# Patient Record
Sex: Male | Born: 2000 | Race: Black or African American | Hispanic: No | Marital: Single | Smoking: Never smoker
Health system: Southern US, Community
[De-identification: ages and names within clinical notes are randomized; demographics above are authoritative.]

---
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
## 2021-12-02 ENCOUNTER — Emergency Department
Admission: EM | Admit: 2021-12-02 | Discharge: 2021-12-02 | Disposition: A | Discharge status: Home / Self Care | Payer: BC Managed Care – PPO | Attending: Emergency Medicine | Admitting: Emergency Medicine

## 2021-12-02 ENCOUNTER — Encounter: Payer: Self-pay | Admitting: Emergency Medicine

## 2021-12-02 ENCOUNTER — Other Ambulatory Visit: Payer: Self-pay

## 2021-12-02 DIAGNOSIS — S1191XA Laceration without foreign body of unspecified part of neck, initial encounter: Secondary | ICD-10-CM | POA: Diagnosis not present

## 2021-12-02 DIAGNOSIS — S6991XA Unspecified injury of right wrist, hand and finger(s), initial encounter: Secondary | ICD-10-CM | POA: Diagnosis present

## 2021-12-02 DIAGNOSIS — S61411A Laceration without foreign body of right hand, initial encounter: Secondary | ICD-10-CM | POA: Diagnosis not present

## 2021-12-02 DIAGNOSIS — X58XXXA Exposure to other specified factors, initial encounter: Secondary | ICD-10-CM | POA: Diagnosis not present

## 2021-12-02 MED ORDER — LIDOCAINE-EPINEPHRINE (PF) 2 %-1:200000 IJ SOLN
10.0000 mL | Freq: Once | INTRAMUSCULAR | Status: AC
  Administered 2021-12-02: 10 mL
  Filled 2021-12-02: qty 20

## 2021-12-02 MED ORDER — CEPHALEXIN 500 MG PO CAPS: 1000.0000 mg | ORAL_CAPSULE | Freq: Two times a day (BID) | ORAL | 0 refills | Status: DC

## 2021-12-02 MED ORDER — HYDROXYZINE PAMOATE 100 MG PO CAPS: 100.0000 mg | ORAL_CAPSULE | Freq: Every evening | ORAL | 0 refills | Status: DC

## 2021-12-02 NOTE — Discharge Instructions (Addendum)
-  Please return to the emergency department, urgent care, or see your regular doctor in 7 to 10 days for suture removal.  -I recommend scheduling appointment with [facility].  You can call them at [phone] to schedule an appointment.  Address is [address], [place], [place] [address]  -Take all of your antibiotics as prescribed.  -Return to the emergency department anytime if you begin to experience any new or worsening symptoms.
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
## 2021-12-02 NOTE — ED Notes (Signed)
Family at bedside   Pt is walking out the door  Family is encouraging pt stay

## 2021-12-03 ENCOUNTER — Emergency Department
Admission: EM | Admit: 2021-12-03 | Discharge: 2021-12-04 | Disposition: A | Discharge status: Other Acute Inpatient Hospital | Payer: BC Managed Care – PPO | Source: Home / Self Care | Attending: Emergency Medicine | Admitting: Emergency Medicine

## 2021-12-03 ENCOUNTER — Other Ambulatory Visit: Payer: Self-pay

## 2021-12-03 DIAGNOSIS — Z20822 Contact with and (suspected) exposure to covid-19: Secondary | ICD-10-CM | POA: Insufficient documentation

## 2021-12-03 DIAGNOSIS — R4689 Other symptoms and signs involving appearance and behavior: Secondary | ICD-10-CM

## 2021-12-03 DIAGNOSIS — Z1339 Encounter for screening examination for other mental health and behavioral disorders: Secondary | ICD-10-CM | POA: Insufficient documentation

## 2021-12-03 DIAGNOSIS — F39 Unspecified mood [affective] disorder: Secondary | ICD-10-CM | POA: Diagnosis not present

## 2021-12-03 DIAGNOSIS — Z046 Encounter for general psychiatric examination, requested by authority: Secondary | ICD-10-CM | POA: Insufficient documentation

## 2021-12-03 DIAGNOSIS — R462 Strange and inexplicable behavior: Secondary | ICD-10-CM | POA: Insufficient documentation

## 2021-12-03 DIAGNOSIS — F29 Unspecified psychosis not due to a substance or known physiological condition: Secondary | ICD-10-CM | POA: Diagnosis not present

## 2021-12-03 LAB — URINALYSIS, ROUTINE W REFLEX MICROSCOPIC
Bacteria, UA: NONE SEEN
Bilirubin Urine: NEGATIVE
Glucose, UA: NEGATIVE mg/dL
Hgb urine dipstick: NEGATIVE
Ketones, ur: NEGATIVE mg/dL
Leukocytes,Ua: NEGATIVE
Nitrite: NEGATIVE
Protein, ur: 100 mg/dL — AB
Specific Gravity, Urine: 1.027 (ref 1.005–1.030)
Squamous Epithelial / HPF: NONE SEEN (ref 0–5)
pH: 5 (ref 5.0–8.0)

## 2021-12-03 LAB — URINE DRUG SCREEN, QUALITATIVE (ARMC ONLY)
Amphetamines, Ur Screen: NOT DETECTED
Barbiturates, Ur Screen: NOT DETECTED
Benzodiazepine, Ur Scrn: NOT DETECTED
Cannabinoid 50 Ng, Ur ~~LOC~~: POSITIVE — AB
Cocaine Metabolite,Ur ~~LOC~~: NOT DETECTED
MDMA (Ecstasy)Ur Screen: NOT DETECTED
Methadone Scn, Ur: NOT DETECTED
Opiate, Ur Screen: NOT DETECTED
Phencyclidine (PCP) Ur S: NOT DETECTED
Tricyclic, Ur Screen: POSITIVE — AB

## 2021-12-03 LAB — CBC WITH DIFFERENTIAL/PLATELET
Abs Immature Granulocytes: 0.01 10*3/uL (ref 0.00–0.07)
Basophils Absolute: 0 10*3/uL (ref 0.0–0.1)
Basophils Relative: 0 %
Eosinophils Absolute: 0 10*3/uL (ref 0.0–0.5)
Eosinophils Relative: 1 %
HCT: 40.9 % (ref 39.0–52.0)
Hemoglobin: 14.4 g/dL (ref 13.0–17.0)
Immature Granulocytes: 0 %
Lymphocytes Relative: 19 %
Lymphs Abs: 1.1 10*3/uL (ref 0.7–4.0)
MCH: 32.1 pg (ref 26.0–34.0)
MCHC: 35.2 g/dL (ref 30.0–36.0)
MCV: 91.1 fL (ref 80.0–100.0)
Monocytes Absolute: 0.8 10*3/uL (ref 0.1–1.0)
Monocytes Relative: 14 %
Neutro Abs: 3.9 10*3/uL (ref 1.7–7.7)
Neutrophils Relative %: 66 %
Platelets: 124 10*3/uL — ABNORMAL LOW (ref 150–400)
RBC: 4.49 MIL/uL (ref 4.22–5.81)
RDW: 12 % (ref 11.5–15.5)
WBC: 5.8 10*3/uL (ref 4.0–10.5)
nRBC: 0 % (ref 0.0–0.2)

## 2021-12-03 LAB — ETHANOL: Alcohol, Ethyl (B): <10 mg/dL (ref <10)

## 2021-12-03 LAB — COMPREHENSIVE METABOLIC PANEL
ALT: 27 U/L (ref 0–44)
AST: 50 U/L — ABNORMAL HIGH (ref 15–41)
Albumin: 5.4 g/dL — ABNORMAL HIGH (ref 3.5–5.0)
Alkaline Phosphatase: 63 U/L (ref 38–126)
Anion gap: 7 (ref 5–15)
BUN: 13 mg/dL (ref 6–20)
CO2: 25 mmol/L (ref 22–32)
Calcium: 9.4 mg/dL (ref 8.9–10.3)
Chloride: 106 mmol/L (ref 98–111)
Creatinine, Ser: 0.98 mg/dL (ref 0.61–1.24)
GFR, Estimated: >60 mL/min (ref >60)
Glucose, Bld: 133 mg/dL — ABNORMAL HIGH (ref 70–99)
Potassium: 3.3 mmol/L — ABNORMAL LOW (ref 3.5–5.1)
Sodium: 138 mmol/L (ref 135–145)
Total Bilirubin: 1.1 mg/dL (ref 0.3–1.2)
Total Protein: 8.3 g/dL — ABNORMAL HIGH (ref 6.5–8.1)

## 2021-12-03 LAB — SARS CORONAVIRUS 2 BY RT PCR: SARS Coronavirus 2 by RT PCR: NEGATIVE

## 2021-12-03 LAB — SALICYLATE LEVEL: Salicylate Lvl: <7 mg/dL — ABNORMAL LOW (ref 7.0–30.0)

## 2021-12-03 LAB — ACETAMINOPHEN LEVEL: Acetaminophen (Tylenol), Serum: <10 ug/mL — ABNORMAL LOW (ref 10–30)

## 2021-12-03 MED ORDER — CEPHALEXIN 500 MG PO CAPS
1000.0000 mg | ORAL_CAPSULE | Freq: Two times a day (BID) | ORAL | Status: DC
  Filled 2021-12-03: qty 2

## 2021-12-03 NOTE — ED Notes (Signed)
IVC prior to arrival/ All legal paper work completed/ Patient taken directly to [facility]

## 2021-12-03 NOTE — ED Notes (Signed)
Pt dressed out in triage room 1 with this writer and Delaney Meigs, EDT present.  Pt belongings: Black hoodie  Black sweatpants  Black t-shirt  Wallace Cullens short Plaid underwear  Pair of black socks Pair of camo crocs
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
## 2021-12-03 NOTE — ED Notes (Signed)
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
## 2021-12-04 ENCOUNTER — Other Ambulatory Visit: Payer: Self-pay

## 2021-12-04 ENCOUNTER — Inpatient Hospital Stay
Admission: AD | Admit: 2021-12-04 | Discharge: 2021-12-11 | DRG: 885 | Disposition: A | Discharge status: Home / Self Care | Payer: BC Managed Care – PPO | Source: Intra-hospital | Attending: Psychiatry | Admitting: Psychiatry

## 2021-12-04 DIAGNOSIS — F32A Depression, unspecified: Secondary | ICD-10-CM | POA: Diagnosis present

## 2021-12-04 DIAGNOSIS — F419 Anxiety disorder, unspecified: Secondary | ICD-10-CM | POA: Diagnosis present

## 2021-12-04 DIAGNOSIS — Z20822 Contact with and (suspected) exposure to covid-19: Secondary | ICD-10-CM | POA: Diagnosis present

## 2021-12-04 DIAGNOSIS — R45851 Suicidal ideations: Secondary | ICD-10-CM | POA: Diagnosis present

## 2021-12-04 DIAGNOSIS — R41843 Psychomotor deficit: Secondary | ICD-10-CM | POA: Diagnosis present

## 2021-12-04 DIAGNOSIS — F29 Unspecified psychosis not due to a substance or known physiological condition: Secondary | ICD-10-CM | POA: Diagnosis present

## 2021-12-04 DIAGNOSIS — G4700 Insomnia, unspecified: Secondary | ICD-10-CM | POA: Diagnosis present

## 2021-12-04 DIAGNOSIS — F209 Schizophrenia, unspecified: Secondary | ICD-10-CM | POA: Diagnosis present

## 2021-12-04 DIAGNOSIS — F39 Unspecified mood [affective] disorder: Secondary | ICD-10-CM | POA: Diagnosis present

## 2021-12-04 DIAGNOSIS — F1290 Cannabis use, unspecified, uncomplicated: Secondary | ICD-10-CM | POA: Diagnosis present

## 2021-12-04 MED ORDER — RISPERIDONE 1 MG PO TBDP
1.0000 mg | ORAL_TABLET | Freq: Every day | ORAL | Status: DC
Start: 2021-12-04 — End: 2021-12-07
  Administered 2021-12-04 – 2021-12-06 (×3): 1 mg via ORAL
  Filled 2021-12-04 (×3): qty 1

## 2021-12-04 MED ORDER — ACETAMINOPHEN 325 MG PO TABS
650.0000 mg | ORAL_TABLET | Freq: Four times a day (QID) | ORAL | Status: DC | PRN
  Administered 2021-12-05: 650 mg via ORAL
  Filled 2021-12-04: qty 2

## 2021-12-04 MED ORDER — ALUM & MAG HYDROXIDE-SIMETH 200-200-20 MG/5ML PO SUSP: 30.0000 mL | ORAL | Status: DC | PRN

## 2021-12-04 MED ORDER — MAGNESIUM HYDROXIDE 400 MG/5ML PO SUSP: 30.0000 mL | Freq: Every day | ORAL | Status: DC | PRN

## 2021-12-04 MED ORDER — BENZTROPINE MESYLATE 1 MG PO TABS
0.5000 mg | ORAL_TABLET | Freq: Every day | ORAL | Status: DC
  Administered 2021-12-04 – 2021-12-10 (×7): 0.5 mg via ORAL
  Filled 2021-12-04 (×7): qty 1

## 2021-12-04 MED ORDER — HYDROXYZINE HCL 50 MG PO TABS
100.0000 mg | ORAL_TABLET | Freq: Every day | ORAL | Status: DC
  Administered 2021-12-04 – 2021-12-10 (×7): 100 mg via ORAL
  Filled 2021-12-04 (×7): qty 2

## 2021-12-04 NOTE — BH Assessment (Signed)
Patient is to be admitted to [facility] by  [facility]  .  Attending Physician will be Dr.  Toni Amend .   Patient has been assigned to room 325, by [facility] Charge Nurse Hamilton.   Intake Paper Work has been signed and placed on patient chart.  ER staff is aware of the admission: Glenda, ER Secretary   Dr. Elesa Massed, ER MD  Selena Batten, Patient's Nurse  Marylene Land, Patient Access.   Pt can arrive ASAP.

## 2021-12-04 NOTE — Plan of Care (Signed)

## 2021-12-04 NOTE — Tx Team (Signed)
Initial Treatment Plan 12/04/2021 5:10 AM Logan Reyes [id number]    PATIENT STRESSORS: Marital or family conflict   Medication change or noncompliance     PATIENT STRENGTHS: Motivation for treatment/growth  Supportive family/friends    PATIENT IDENTIFIED PROBLEMS: Patient is unsure                     DISCHARGE CRITERIA:  Motivation to continue treatment in a less acute level of care Verbal commitment to aftercare and medication compliance  PRELIMINARY DISCHARGE PLAN: Outpatient therapy Return to previous living arrangement  PATIENT/FAMILY INVOLVEMENT: This treatment plan has been presented to and reviewed with the patient, Logan Reyes. The patient has been given the opportunity to ask questions and make suggestions.  Elmyra Ricks, RN 12/04/2021, 5:10 AM

## 2021-12-04 NOTE — Progress Notes (Signed)
Pt denies SI/HI/AVH and verbally agrees to approach staff if these become apparent or before harming themselves/others. Rates depression 0/10. Rates anxiety 0/10. Rates pain 0/10.  Pt would shake his head yes and no at the same time and then would answer no. Pt also said his name was something different but will respond to [facility]. Pt has tried to walk off the unit multiple times but is having no aggressive behavior. Pt will pace around the unit holding a bag of his things. Pt responds seldomly. Pt sat in a fetal position on a chair in the group room. Pt has been in the room for most the rest of the day but has gone up for meals. Pt will only respond with minimal words. Scheduled medications administered to pt, per MD orders. RN provided support and encouragement to pt. Q15 min safety checks implemented and continued. Pt safe on the unit. RN will continue to monitor and intervene as needed. Problem: Role Relationship: Goal: Ability to communicate needs accurately will improve Outcome: Not Progressing Goal: Ability to interact with others will improve Outcome: Not Progressing       12/04/21 0820  Psych Admission Type (Psych Patients Only)  Admission Status Involuntary  Psychosocial Assessment  Patient Complaints Anxiety  Eye Contact None  Facial Expression Blank;Flat;Pensive  Affect Constricted;Flat  Speech Soft  Interaction Forwards little;Guarded;Minimal  Motor Activity Slow  Appearance/Hygiene Unremarkable;In scrubs  Behavior Characteristics Guarded;Cooperative  Mood Preoccupied;Anxious  Thought Process  Coherency Blocking  Content Preoccupation  Delusions None reported or observed  Perception Hallucinations  Hallucination None reported or observed  Judgment Limited  Confusion Mild  Danger to Self  Current suicidal ideation? Denies  Danger to Others  Danger to Others None reported or observed

## 2021-12-04 NOTE — Progress Notes (Signed)
Recreation Therapy Notes  Date: 12/04/2021  Time: 10:05 am   Location: Courtyard      Behavioral response: N/A   Intervention Topic: Leisure    Discussion/Intervention: Patient did not attend group.   Clinical Observations/Feedback:  Patient did not attend group.   Breion Novacek LRT/CTRS        Emiko Osorto 12/04/2021 12:36 PM

## 2021-12-04 NOTE — H&P (Signed)
Psychiatric Admission Assessment Adult  Patient Identification: Logan Reyes MRN:  [id number] Date of Evaluation:  12/04/2021 Chief Complaint:  Mood disorder [facility]) [F39] Principal Diagnosis: Psychosis ([place]) Diagnosis:  Principal Problem:   Psychosis ([place]) Active Problems:   Mood disorder ([place])  History of Present Illness: 21 year old male brought to the hospital and placed under IVC.  Patient tells me "they told me I needed a checkup".  He clearly is unable to articulate or has no memory of exactly what happened that brought him in.  Putting together him and the notes in the chart it sounds like a few days ago he started displaying some psychotic symptoms and left home.  Showed up sometime later disheveled with a cut on his neck in his hand.  Unclear where those came from.  On interview with me patient endorses that he has been "going through stuff" and not been feeling good.  Has a hard time explaining it.  He answers yes to questions about hearing and seeing things and during the interview has thought blocking and is constantly looking around the room like he is seeing things.  He says he has had suicidal thoughts but will not elaborate and says he has no intention of acting on it.  Denies homicidal ideation.  Patient says he uses cannabis only occasionally and not necessarily more than he has in the past.  Denies other drug use.  Prior to the last few days he says he lives at home with his parents not working mostly sits around playing video games. Associated Signs/Symptoms: Depression Symptoms:  depressed mood, difficulty concentrating, impaired memory, suicidal thoughts without plan, Duration of Depression Symptoms: No data recorded (Hypo) Manic Symptoms:  Impulsivity, Anxiety Symptoms:  Excessive Worry, Psychotic Symptoms:  Hallucinations: Auditory Visual PTSD Symptoms: Negative Total Time spent with patient: 1 hour  Past Psychiatric History: Does not appear to have any past  psychiatric history.  No previous medication no suicide attempts no hospitalization.  Is the patient at risk to self? Yes.    Has the patient been a risk to self in the past 6 months? No.  Has the patient been a risk to self within the distant past? No.  Is the patient a risk to others? No.  Has the patient been a risk to others in the past 6 months? No.  Has the patient been a risk to others within the distant past? No.   [place] Scale:  [place] Admission (Current) from 12/04/2021 in [place] ED from 12/03/2021 in [place] ED from 12/02/2021 in [place] No Risk No Risk No Risk        Prior Inpatient Therapy:   Prior Outpatient Therapy:    Alcohol Screening: 1. How often do you have a drink containing alcohol?: Never 2. How many drinks containing alcohol do you have on a typical day when you are drinking?: 1 or 2 3. How often do you have six or more drinks on one occasion?: Never AUDIT-C Score: 0 4. How often during the last year have you found that you were not able to stop drinking once you had started?: Never 5. How often during the last year have you failed to do what was normally expected from you because of drinking?: Never 6. How often during the last year have you needed a first drink in the morning to get yourself going after a heavy drinking session?: Never 7. How  often during the last year have you had a feeling of guilt of remorse after drinking?: Never 8. How often during the last year have you been unable to remember what happened the night before because you had been drinking?: Never 9. Have you or someone else been injured as a result of your drinking?: No 10. Has a relative or friend or a doctor or another health worker been concerned about your drinking or suggested you cut down?: No Alcohol Use Disorder Identification Test Final  Score (AUDIT): 0 Substance Abuse History in the last 12 months:  Yes.   Consequences of Substance Abuse: Cannabis use although it remains unclear whether this has been to a really pathological extent and whether or not it has been what is driving his current symptoms Previous Psychotropic Medications: No  Psychological Evaluations: No  Past Medical History: History reviewed. No pertinent past medical history. History reviewed. No pertinent surgical history. Family History: History reviewed. No pertinent family history. Family Psychiatric  History: Patient does not know of any Tobacco Screening:   Social History:  Social History   Substance and Sexual Activity  Alcohol Use Never     Social History   Substance and Sexual Activity  Drug Use Yes   Types: Marijuana    Additional Social History:                           Allergies:  No Known Allergies Lab Results:  Results for orders placed or performed during the hospital encounter of 12/03/21 (from the past 48 hour(s))  Urine Drug Screen, Qualitative     Status: Abnormal   Collection Time: 12/03/21  7:35 PM  Result Value Ref Range   Tricyclic, Ur Screen POSITIVE (A) NONE DETECTED   Amphetamines, Ur Screen NONE DETECTED NONE DETECTED   MDMA (Ecstasy)Ur Screen NONE DETECTED NONE DETECTED   Cocaine Metabolite,Ur [place] NONE DETECTED NONE DETECTED   Opiate, Ur Screen NONE DETECTED NONE DETECTED   Phencyclidine (PCP) Ur S NONE DETECTED NONE DETECTED   Cannabinoid 50 Ng, Ur [place] POSITIVE (A) NONE DETECTED   Barbiturates, Ur Screen NONE DETECTED NONE DETECTED   Benzodiazepine, Ur Scrn NONE DETECTED NONE DETECTED   Methadone Scn, Ur NONE DETECTED NONE DETECTED    Comment: (NOTE) Tricyclics + metabolites, urine    Cutoff 1000 ng/mL Amphetamines + metabolites, urine  Cutoff 1000 ng/mL MDMA (Ecstasy), urine              Cutoff 500 ng/mL Cocaine Metabolite, urine          Cutoff 300 ng/mL Opiate + metabolites, urine        Cutoff  300 ng/mL Phencyclidine (PCP), urine         Cutoff 25 ng/mL Cannabinoid, urine                 Cutoff 50 ng/mL Barbiturates + metabolites, urine  Cutoff 200 ng/mL Benzodiazepine, urine              Cutoff 200 ng/mL Methadone, urine                   Cutoff 300 ng/mL  The urine drug screen provides only a preliminary, unconfirmed analytical test result and should not be used for non-medical purposes. Clinical consideration and professional judgment should be applied to any positive drug screen result due to possible interfering substances. A more specific alternate chemical method must be used in order to obtain a  confirmed analytical result. Gas chromatography / mass spectrometry (GC/MS) is the preferred confirm atory method. Performed at [facility], [address] [place], [place] [address]   Urinalysis, Routine w reflex microscopic Urine, Clean Catch     Status: Abnormal   Collection Time: 12/03/21  7:35 PM  Result Value Ref Range   Color, Urine AMBER (A) YELLOW    Comment: BIOCHEMICALS MAY BE AFFECTED BY COLOR   APPearance HAZY (A) CLEAR   Specific Gravity, Urine 1.027 1.005 - 1.030   pH 5.0 5.0 - 8.0   Glucose, UA NEGATIVE NEGATIVE mg/dL   Hgb urine dipstick NEGATIVE NEGATIVE   Bilirubin Urine NEGATIVE NEGATIVE   Ketones, ur NEGATIVE NEGATIVE mg/dL   Protein, ur 604 (A) NEGATIVE mg/dL   Nitrite NEGATIVE NEGATIVE   Leukocytes,Ua NEGATIVE NEGATIVE   RBC / HPF 0-5 0 - 5 RBC/hpf   WBC, UA 0-5 0 - 5 WBC/hpf   Bacteria, UA NONE SEEN NONE SEEN   Squamous Epithelial / LPF NONE SEEN 0 - 5   Mucus PRESENT     Comment: Performed at [facility], [address] [place], [place] [address]  CBC with Differential     Status: Abnormal   Collection Time: 12/03/21  7:37 PM  Result Value Ref Range   WBC 5.8 4.0 - 10.5 K/uL   RBC 4.49 4.22 - 5.81 MIL/uL   Hemoglobin 14.4 13.0 - 17.0 g/dL   HCT 11.9 14.7 - 82.9 %   MCV 91.1 80.0 - 100.0 fL   MCH 32.1 26.0 - 34.0  pg   MCHC 35.2 30.0 - 36.0 g/dL   RDW 56.2 13.0 - 86.5 %   Platelets 124 (L) 150 - 400 K/uL   nRBC 0.0 0.0 - 0.2 %   Neutrophils Relative % 66 %   Neutro Abs 3.9 1.7 - 7.7 K/uL   Lymphocytes Relative 19 %   Lymphs Abs 1.1 0.7 - 4.0 K/uL   Monocytes Relative 14 %   Monocytes Absolute 0.8 0.1 - 1.0 K/uL   Eosinophils Relative 1 %   Eosinophils Absolute 0.0 0.0 - 0.5 K/uL   Basophils Relative 0 %   Basophils Absolute 0.0 0.0 - 0.1 K/uL   Immature Granulocytes 0 %   Abs Immature Granulocytes 0.01 0.00 - 0.07 K/uL    Comment: Performed at [facility], [address] [place], [place] [address]  Comprehensive metabolic panel     Status: Abnormal   Collection Time: 12/03/21  7:37 PM  Result Value Ref Range   Sodium 138 135 - 145 mmol/L   Potassium 3.3 (L) 3.5 - 5.1 mmol/L   Chloride 106 98 - 111 mmol/L   CO2 25 22 - 32 mmol/L   Glucose, Bld 133 (H) 70 - 99 mg/dL    Comment: Glucose reference range applies only to samples taken after fasting for at least 8 hours.   BUN 13 6 - 20 mg/dL   Creatinine, Ser 6.29 0.61 - 1.24 mg/dL   Calcium 9.4 8.9 - 52.8 mg/dL   Total Protein 8.3 (H) 6.5 - 8.1 g/dL   Albumin 5.4 (H) 3.5 - 5.0 g/dL   AST 50 (H) 15 - 41 U/L   ALT 27 0 - 44 U/L   Alkaline Phosphatase 63 38 - 126 U/L   Total Bilirubin 1.1 0.3 - 1.2 mg/dL   GFR, Estimated >41 >32 mL/min    Comment: (NOTE) Calculated using the CKD-EPI Creatinine Equation (2021)    Anion gap  7 5 - 15    Comment: Performed at [facility], [address] [place], [place] [id number]  Salicylate level     Status: Abnormal   Collection Time: 12/03/21  7:37 PM  Result Value Ref Range   Salicylate Lvl Q000111Q (L) 7.0 - 30.0 mg/dL    Comment: Performed at [facility], [address] [place], [place] [address]  Ethanol     Status: None   Collection Time: 12/03/21  7:37 PM  Result Value Ref Range   Alcohol, Ethyl (B) <10 <10 mg/dL    Comment: (NOTE) Lowest detectable limit for  serum alcohol is 10 mg/dL.  For medical purposes only. Performed at [facility], [address] [place], [place] [address]   Acetaminophen level     Status: Abnormal   Collection Time: 12/03/21  7:37 PM  Result Value Ref Range   Acetaminophen (Tylenol), Serum <10 (L) 10 - 30 ug/mL    Comment: (NOTE) Therapeutic concentrations vary significantly. A range of 10-30 ug/mL  may be an effective concentration for many patients. However, some  are best treated at concentrations outside of this range. Acetaminophen concentrations >150 ug/mL at 4 hours after ingestion  and >50 ug/mL at 12 hours after ingestion are often associated with  toxic reactions.  Performed at [facility], [address] [place], [place]  [address]   SARS Coronavirus 2 by RT PCR (hospital order, performed in [facility] hospital lab) *cepheid single result test* Anterior Nasal Swab     Status: None   Collection Time: 12/03/21 10:29 PM   Specimen: Anterior Nasal Swab  Result Value Ref Range   SARS Coronavirus 2 by RT PCR NEGATIVE NEGATIVE    Comment: (NOTE) SARS-CoV-2 target nucleic acids are NOT DETECTED.  The SARS-CoV-2 RNA is generally detectable in upper and lower respiratory specimens during the acute phase of infection. The lowest concentration of SARS-CoV-2 viral copies this assay can detect is 250 copies / mL. A negative result does not preclude SARS-CoV-2 infection and should not be used as the sole basis for treatment or other patient management decisions.  A negative result may occur with improper specimen collection / handling, submission of specimen other than nasopharyngeal swab, presence of viral mutation(s) within the areas targeted by this assay, and inadequate number of viral copies (<250 copies / mL). A negative result must be combined with clinical observations, patient history, and epidemiological information.  Fact Sheet for Patients:    https://www.patel.info/  Fact Sheet for Healthcare Providers: https://hall.com/  This test is not yet approved or  cleared by the [place] FDA and has been authorized for detection and/or diagnosis of SARS-CoV-2 by FDA under an Emergency Use Authorization (EUA).  This EUA will remain in effect (meaning this test can be used) for the duration of the COVID-19 declaration under Section 564(b)(1) of the Act, 21 U.S.C. section 360bbb-3(b)(1), unless the authorization is terminated or revoked sooner.  Performed at [facility], [address] [place], [place] [address]     Blood Alcohol level:  Lab Results  Component Value Date   [facility] <10 [id number]    Metabolic Disorder Labs:  No results found for: "HGBA1C", "MPG" No results found for: "PROLACTIN" No results found for: "CHOL", "TRIG", "HDL", "CHOLHDL", "VLDL", "LDLCALC"  Current Medications: Current Facility-Administered Medications  Medication Dose Route Frequency Provider Last Rate Last Admin   acetaminophen (TYLENOL) tablet 650 mg  650 mg Oral Q6H PRN Caroline Sauger, NP  alum & mag hydroxide-simeth (MAALOX/MYLANTA) 200-200-20 MG/5ML suspension 30 mL  30 mL Oral Q4H PRN Caroline Sauger, NP       hydrOXYzine (ATARAX) tablet 100 mg  100 mg Oral QHS Caroline Sauger, NP       magnesium hydroxide (MILK OF MAGNESIA) suspension 30 mL  30 mL Oral Daily PRN Caroline Sauger, NP       PTA Medications: Medications Prior to Admission  Medication Sig Dispense Refill Last Dose   cephALEXin (KEFLEX) 500 MG capsule Take 2 capsules (1,000 mg total) by mouth 2 (two) times daily for 7 days. 28 capsule 0    hydrOXYzine (VISTARIL) 100 MG capsule Take 1 capsule (100 mg total) by mouth at bedtime. 30 capsule 0     Musculoskeletal: Strength & Muscle Tone: within normal limits Gait & Station: normal Patient leans: N/A            Psychiatric Specialty  Exam:  Presentation  General Appearance: No data recorded Eye Contact:No data recorded Speech:No data recorded Speech Volume:No data recorded Handedness:No data recorded  Mood and Affect  Mood:No data recorded Affect:No data recorded  Thought Process  Thought Processes:No data recorded Duration of Psychotic Symptoms: No data recorded Past Diagnosis of Schizophrenia or Psychoactive disorder: No data recorded Descriptions of Associations:No data recorded Orientation:No data recorded Thought Content:No data recorded Hallucinations:No data recorded Ideas of Reference:No data recorded Suicidal Thoughts:No data recorded Homicidal Thoughts:No data recorded  Sensorium  Memory:No data recorded Judgment:No data recorded Insight:No data recorded  Executive Functions  Concentration:No data recorded Attention Span:No data recorded Recall:No data recorded Fund of Knowledge:No data recorded Language:No data recorded  Psychomotor Activity  Psychomotor Activity:No data recorded  Assets  Assets:No data recorded  Sleep  Sleep:No data recorded   Physical Exam: Physical Exam Vitals and nursing note reviewed.  Constitutional:      Appearance: Normal appearance.  HENT:     Head: Normocephalic and atraumatic.     Mouth/Throat:     Pharynx: Oropharynx is clear.  Eyes:     Pupils: Pupils are equal, round, and reactive to light.  Cardiovascular:     Rate and Rhythm: Normal rate and regular rhythm.  Pulmonary:     Effort: Pulmonary effort is normal.     Breath sounds: Normal breath sounds.  Abdominal:     General: Abdomen is flat.     Palpations: Abdomen is soft.  Musculoskeletal:        General: Normal range of motion.  Skin:    General: Skin is warm and dry.  Neurological:     General: No focal deficit present.     Mental Status: He is alert. Mental status is at baseline.  Psychiatric:        Attention and Perception: He is inattentive. He perceives auditory  hallucinations.        Mood and Affect: Affect is blunt.        Speech: Speech is delayed.        Behavior: Behavior is slowed.        Thought Content: Thought content includes suicidal ideation. Thought content does not include suicidal plan.        Cognition and Memory: Memory is impaired.    Review of Systems  Constitutional: Negative.   HENT: Negative.    Eyes: Negative.   Respiratory: Negative.    Cardiovascular: Negative.   Gastrointestinal: Negative.   Musculoskeletal: Negative.   Skin: Negative.   Neurological: Negative.   Psychiatric/Behavioral:  Positive for depression,  hallucinations, memory loss, substance abuse and suicidal ideas. The patient is nervous/anxious.    Blood pressure (!) 153/95, pulse 74, temperature 98.9 F (37.2 C), temperature source Oral, resp. rate 18, height 5\' 11"  (1.803 m), weight 74.8 kg, SpO2 100 %. Body mass index is 23 kg/m.  Treatment Plan Summary: Daily contact with patient to assess and evaluate symptoms and progress in treatment, Medication management, and Plan patient has thought blocking flat affect very slow and disorganized communication and looks like he is hallucinating.  Putting this together with what sounds like premorbid poor functioning I think there is a high risk for this being first onset schizophrenia.  At this point however cannot be certain.  Could be substance abuse or depression.  I suggest to the patient that we start him on a low-dose of Risperdal explaining this will help with hallucinations and confused thinking and he agrees to that.  We will continue to monitor and do full treatment team evaluation.  Observation Level/Precautions:  15 minute checks  Laboratory:  UDS  Psychotherapy:    Medications:    Consultations:    Discharge Concerns:    Estimated LOS:  Other:     Physician Treatment Plan for Primary Diagnosis: Psychosis (HCC) Long Term Goal(s): Improvement in symptoms so as ready for discharge  Short Term  Goals: Ability to verbalize feelings will improve, Ability to disclose and discuss suicidal ideas, and Ability to demonstrate self-control will improve  Physician Treatment Plan for Secondary Diagnosis: Principal Problem:   Psychosis (HCC) Active Problems:   Mood disorder (HCC)  Long Term Goal(s): Improvement in symptoms so as ready for discharge  Short Term Goals: Ability to maintain clinical measurements within normal limits will improve and Compliance with prescribed medications will improve  I certify that inpatient services furnished can reasonably be expected to improve the patient's condition.    , MD 9/7/20233:22 PM

## 2021-12-04 NOTE — [facility] Counselor (Signed)
CSW attempted to complete assessment with the patient.    Patient initially did not identify himself when CSW attempted to verify his name.  CSW asked patient to look at his name badge and patient was able to verify that he was who CSW was looking for.   CSW attempted to complete PSA and patient declined by shaking his head.  CSW team to follow up with PSA at a later date/time.  Penni Homans, MSW, LCSW 12/04/2021 3:02 PM

## 2021-12-04 NOTE — [facility] Suicide Risk Assessment (Signed)
[facility] Admission Suicide Risk Assessment   Nursing information obtained from:  Patient Demographic factors:  NA Current Mental Status:  NA Loss Factors:  NA Historical Factors:  NA Risk Reduction Factors:  NA  Total Time spent with patient: 1 hour Principal Problem: Psychosis (HCC) Diagnosis:  Principal Problem:   Psychosis (HCC) Active Problems:   Mood disorder (HCC)  Subjective Data: Patient seen and chart reviewed.  21 year old man with what sounds like new onset of overt psychotic symptoms.  Patient had appeared in the emergency room with lacerations on his neck and hand.  He does not remember how they got there and so it is unclear if they could have been self-inflicted.  Patient claims to have no memory of it.  Patient is very withdrawn and has some thought blocking but nodded yes to me that he was having suicidal thoughts but without any plan or intent.  He has not been acting out so far and has been cooperative  Continued Clinical Symptoms:  Alcohol Use Disorder Identification Test Final Score (AUDIT): 0 The "Alcohol Use Disorders Identification Test", Guidelines for Use in Primary Care, Second Edition.  World Science writer [facility]). Score between 0-7:  no or low risk or alcohol related problems. Score between 8-15:  moderate risk of alcohol related problems. Score between 16-19:  high risk of alcohol related problems. Score 20 or above:  warrants further diagnostic evaluation for alcohol dependence and treatment.   CLINICAL FACTORS:   Currently Psychotic   Musculoskeletal: Strength & Muscle Tone: within normal limits Gait & Station: normal Patient leans: N/A  Psychiatric Specialty Exam:  Presentation  General Appearance: No data recorded Eye Contact:No data recorded Speech:No data recorded Speech Volume:No data recorded Handedness:No data recorded  Mood and Affect  Mood:No data recorded Affect:No data recorded  Thought Process  Thought Processes:No data  recorded Descriptions of Associations:No data recorded Orientation:No data recorded Thought Content:No data recorded History of Schizophrenia/Schizoaffective disorder:No data recorded Duration of Psychotic Symptoms:No data recorded Hallucinations:No data recorded Ideas of Reference:No data recorded Suicidal Thoughts:No data recorded Homicidal Thoughts:No data recorded  Sensorium  Memory:No data recorded Judgment:No data recorded Insight:No data recorded  Executive Functions  Concentration:No data recorded Attention Span:No data recorded Recall:No data recorded Fund of Knowledge:No data recorded Language:No data recorded  Psychomotor Activity  Psychomotor Activity:No data recorded  Assets  Assets:No data recorded  Sleep  Sleep:No data recorded   Physical Exam: Physical Exam Vitals and nursing note reviewed.  Constitutional:      Appearance: Normal appearance.  HENT:     Head: Normocephalic and atraumatic.     Mouth/Throat:     Pharynx: Oropharynx is clear.  Eyes:     Pupils: Pupils are equal, round, and reactive to light.  Cardiovascular:     Rate and Rhythm: Normal rate and regular rhythm.  Pulmonary:     Effort: Pulmonary effort is normal.     Breath sounds: Normal breath sounds.  Abdominal:     General: Abdomen is flat.     Palpations: Abdomen is soft.  Musculoskeletal:        General: Normal range of motion.  Skin:    General: Skin is warm and dry.       Neurological:     General: No focal deficit present.     Mental Status: He is alert. Mental status is at baseline.  Psychiatric:        Attention and Perception: He is inattentive. He perceives auditory hallucinations.  Mood and Affect: Mood normal. Affect is blunt.        Speech: He is noncommunicative. Speech is delayed.        Behavior: Behavior is slowed and withdrawn.        Thought Content: Thought content includes suicidal ideation. Thought content does not include suicidal plan.         Cognition and Memory: Memory is impaired.        Judgment: Judgment is inappropriate.    Review of Systems  Constitutional: Negative.   HENT: Negative.    Eyes: Negative.   Respiratory: Negative.    Cardiovascular: Negative.   Gastrointestinal: Negative.   Musculoskeletal: Negative.   Skin: Negative.   Neurological: Negative.   Psychiatric/Behavioral:  Positive for depression, hallucinations, memory loss, substance abuse and suicidal ideas. The patient is nervous/anxious.    Blood pressure (!) 153/95, pulse 74, temperature 98.9 F (37.2 C), temperature source Oral, resp. rate 18, height 5\' 11"  (1.803 m), weight 74.8 kg, SpO2 100 %. Body mass index is 23 kg/m.   COGNITIVE FEATURES THAT CONTRIBUTE TO RISK:  Loss of executive function    SUICIDE RISK:   Minimal: No identifiable suicidal ideation.  Patients presenting with no risk factors but with morbid ruminations; may be classified as minimal risk based on the severity of the depressive symptoms  PLAN OF CARE: Continue 15-minute checks.  Discussed diagnosis and treatment with patient and offered starting antipsychotic medication.  Include in individual and group therapy.  Full treatment team evaluation.  Ongoing assessment of dangerousness prior to discharge  I certify that inpatient services furnished can reasonably be expected to improve the patient's condition.   , MD 12/04/2021, 3:20 PM

## 2021-12-04 NOTE — BH Assessment (Signed)
Recommendations for Services/Supports/Treatments: Per Logan Reyes, LCASA pt recommended for inpatient treatment.  Per IVC paperwork from [place] pt presented under IVC via [facility] at 4:46 PM 12/03/21, met IVC criteria, and was faxed out to multiple facilities.  [facility] obtained collateral from mother/petitioner Logan Reyes [phone]) who according to paperwork "pt wandered off walking around 12/01/21 from home in [place] to [facility] and would not answer the phone, and parents put out a missing person police report. Mother reported client had walked through [place]b wires and was wading in a pond. Mother denies pt hx of SI/HI/ thoughts, plan, or intent or AVH prior to 9/423. Pt lives with mother, father, and younger brother. Mother reported that prior to new onset of behavior pt would spend time playing video games and not work. + for Sleep and appetite decrease. Pt has a hx of marijuana use; last use 2 weeks ago and smokes 1 blunt a day. Mother reported today client shaved head, refused to speak and acting bizarre from baseline."

## 2021-12-04 NOTE — Group Note (Signed)
[facility] LCSW Group Therapy Note   Group Date: 12/04/2021 Start Time: 1300 End Time: 1400   Type of Therapy/Topic:  Group Therapy:  Balance in Life  Participation Level:  Did Not Attend   Description of Group:    This group will address the concept of balance and how it feels and looks when one is unbalanced. Patients will be encouraged to process areas in their lives that are out of balance, and identify reasons for remaining unbalanced. Facilitators will guide patients utilizing problem- solving interventions to address and correct the stressor making their life unbalanced. Understanding and applying boundaries will be explored and addressed for obtaining  and maintaining a balanced life. Patients will be encouraged to explore ways to assertively make their unbalanced needs known to significant others in their lives, using other group members and facilitator for support and feedback.  Therapeutic Goals: Patient will identify two or more emotions or situations they have that consume much of in their lives. Patient will identify signs/triggers that life has become out of balance:  Patient will identify two ways to set boundaries in order to achieve balance in their lives:  Patient will demonstrate ability to communicate their needs through discussion and/or role plays  Summary of Patient Progress: X   Therapeutic Modalities:   Cognitive Behavioral Therapy Solution-Focused Therapy Assertiveness Training   Farah Lepak R Ynez Eugenio, LCSW 

## 2021-12-04 NOTE — BH Assessment (Signed)
This pt was under review with [facility], upon speaking to [employer], bed is no longer available.

## 2021-12-04 NOTE — Progress Notes (Signed)
Patient admitted from [facility], report received from [place], [place]. Pt presents with bizarre behaviors. Pt would only answer questions this writer asked with a no or I don't remember. Pt is a poor historian on why he is here. Pt oriented to the unit and his room. Pt given education, support, and encouragement to be active in his treatment plan. Pt being monitored Q 15 minutes for safety per unit protocol, remains safe on the unit.

## 2021-12-04 NOTE — ED Notes (Signed)
IVC/Pending Placement 

## 2021-12-04 NOTE — Progress Notes (Signed)
Pt presents with a bizarre affect, continually asking this Clinical research associate, "Can I leave now?" Pt given education. Pt minimal with staff and peers and isolates to his room. Pt compliant with medication administration per MD orders. Pt given support and encouragement to be active in his treatment plan. Pt being monitored Q 15 minutes for safety per unit protocol, remains safe on the unit

## 2021-12-05 DIAGNOSIS — F29 Unspecified psychosis not due to a substance or known physiological condition: Secondary | ICD-10-CM | POA: Diagnosis not present

## 2021-12-05 LAB — LIPID PANEL
Cholesterol: 120 mg/dL (ref 0–200)
HDL: 43 mg/dL (ref >40)
LDL Cholesterol: 68 mg/dL (ref 0–99)
Total CHOL/HDL Ratio: 2.8 RATIO
Triglycerides: 47 mg/dL (ref <150)
VLDL: 9 mg/dL (ref 0–40)

## 2021-12-05 LAB — HEMOGLOBIN A1C
Hgb A1c MFr Bld: 5 % (ref 4.8–5.6)
Mean Plasma Glucose: 96.8 mg/dL

## 2021-12-05 NOTE — Progress Notes (Signed)
Recreation Therapy Notes   Date: 12/05/2021  Time: 10:55 am   Location: Craft room       Behavioral response: N/A   Intervention Topic: Self-care    Discussion/Intervention: Patient did not attend group.   Clinical Observations/Feedback:  Patient did not attend group.   Logan Reyes LRT/CTRS        Logan Reyes 12/05/2021 1:09 PM

## 2021-12-05 NOTE — Progress Notes (Signed)
Recreation Therapy Notes  INPATIENT RECREATION THERAPY ASSESSMENT  Patient Details Name: Logan Reyes MRN: [id number] DOB: Mar 16, 2001 Today's Date: 12/05/2021       Information Obtained From: Patient  Able to Participate in Assessment/Interview: Yes  Patient Presentation: Responsive  Reason for Admission (Per Patient): Active Symptoms  Patient Stressors:    Coping Skills:   Read, Other (Comment) (Smoke)  Leisure Interests (2+):  Sports - Basketball, Individual - Reading, Games - Video games  Frequency of Recreation/Participation: Chief Executive Officer of Community Resources:  Yes  Community Resources:  [employer], [place], [employer]  Current Use: Yes  If no, Barriers?:    Expressed Interest in [employer] Information: Yes  County of Residence:  [place]  Patient Main Form of Transportation: Other (Comment) (My mother)  Patient Strengths:  Helping people  Patient Identified Areas of Improvement:  Learn to communicatr more  Patient Goal for Hospitalization:  Go to college  Current SI (including self-harm):  No  Current HI:  No  Current AVH: No  Staff Intervention Plan: Group Attendance, Collaborate with Interdisciplinary Treatment Team  Consent to Intern Participation: N/A  Logan Reyes 12/05/2021, 2:48 PM

## 2021-12-05 NOTE — [facility] Group Notes (Signed)
[facility] Group Notes:  (Nursing/MHT/Case Management/Adjunct)  Date:  12/05/2021  Time:  3:39 AM  Type of Therapy:  Group Therapy  Participation Level:  Active  Participation Quality:  Appropriate  Affect:  Appropriate  Cognitive:  Alert  Insight:  Appropriate  Engagement in Group:  Engaged  Modes of Intervention:  Discussion  Summary of Progress/Problems:go to college.  Diona Browner 12/05/2021, 3:39 AM

## 2021-12-05 NOTE — Group Note (Signed)
[facility] LCSW Group Therapy Note   Group Date: 12/05/2021 Start Time: 1300 End Time: 1400  Type of Therapy and Topic:  Group Therapy:  Feelings around Relapse and Recovery  Participation Level:  Did Not Attend    Description of Group:    Patients in this group will discuss emotions they experience before and after a relapse. They will process how experiencing these feelings, or avoidance of experiencing them, relates to having a relapse. Facilitator will guide patients to explore emotions they have related to recovery. Patients will be encouraged to process which emotions are more powerful. They will be guided to discuss the emotional reaction significant others in their lives may have to patients' relapse or recovery. Patients will be assisted in exploring ways to respond to the emotions of others without this contributing to a relapse.  Therapeutic Goals: Patient will identify two or more emotions that lead to relapse for them:  Patient will identify two emotions that result when they relapse:  Patient will identify two emotions related to recovery:  Patient will demonstrate ability to communicate their needs through discussion and/or role plays.   Summary of Patient Progress: X   Therapeutic Modalities:   Cognitive Behavioral Therapy Solution-Focused Therapy Assertiveness Training Relapse Prevention Therapy   Nidya Bouyer R Anterio Scheel, LCSW 

## 2021-12-05 NOTE — BH IP Treatment Plan (Signed)
Interdisciplinary Treatment and Diagnostic Plan Update  12/05/2021 Time of Session: 10:02 Logan Reyes MRN: [id number]  Principal Diagnosis: Psychosis [facility])  Secondary Diagnoses: Principal Problem:   Psychosis ([place]) Active Problems:   Mood disorder ([place])   Current Medications:  Current Facility-Administered Medications  Medication Dose Route Frequency Provider Last Rate Last Admin   acetaminophen (TYLENOL) tablet 650 mg  650 mg Oral Q6H PRN Caroline Sauger, NP   650 mg at 12/05/21 1093   alum & mag hydroxide-simeth (MAALOX/MYLANTA) 200-200-20 MG/5ML suspension 30 mL  30 mL Oral Q4H PRN Caroline Sauger, NP       benztropine (COGENTIN) tablet 0.5 mg  0.5 mg Oral QHS Clapacs, John T, MD   0.5 mg at 12/04/21 2116   hydrOXYzine (ATARAX) tablet 100 mg  100 mg Oral QHS Caroline Sauger, NP   100 mg at 12/04/21 2116   magnesium hydroxide (MILK OF MAGNESIA) suspension 30 mL  30 mL Oral Daily PRN Caroline Sauger, NP       risperiDONE (RISPERDAL M-TABS) disintegrating tablet 1 mg  1 mg Oral QHS Clapacs, John T, MD   1 mg at 12/04/21 2116   PTA Medications: Medications Prior to Admission  Medication Sig Dispense Refill Last Dose   cephALEXin (KEFLEX) 500 MG capsule Take 2 capsules (1,000 mg total) by mouth 2 (two) times daily for 7 days. 28 capsule 0    hydrOXYzine (VISTARIL) 100 MG capsule Take 1 capsule (100 mg total) by mouth at bedtime. 30 capsule 0     Patient Stressors: Marital or family conflict   Medication change or noncompliance    Patient Strengths: Motivation for treatment/growth  Supportive family/friends   Treatment Modalities: Medication Management, Group therapy, Case management,  1 to 1 session with clinician, Psychoeducation, Recreational therapy.   Physician Treatment Plan for Primary Diagnosis: Psychosis ([place]) Long Term Goal(s): Improvement in symptoms so as ready for discharge   Short Term Goals: Ability to maintain clinical measurements within  normal limits will improve Compliance with prescribed medications will improve Ability to verbalize feelings will improve Ability to disclose and discuss suicidal ideas Ability to demonstrate self-control will improve  Medication Management: Evaluate patient's response, side effects, and tolerance of medication regimen.  Therapeutic Interventions: 1 to 1 sessions, Unit Group sessions and Medication administration.  Evaluation of Outcomes: Not Met  Physician Treatment Plan for Secondary Diagnosis: Principal Problem:   Psychosis ([place]) Active Problems:   Mood disorder ([place])  Long Term Goal(s): Improvement in symptoms so as ready for discharge   Short Term Goals: Ability to maintain clinical measurements within normal limits will improve Compliance with prescribed medications will improve Ability to verbalize feelings will improve Ability to disclose and discuss suicidal ideas Ability to demonstrate self-control will improve     Medication Management: Evaluate patient's response, side effects, and tolerance of medication regimen.  Therapeutic Interventions: 1 to 1 sessions, Unit Group sessions and Medication administration.  Evaluation of Outcomes: Not Met   RN Treatment Plan for Primary Diagnosis: Psychosis ([place]) Long Term Goal(s): Knowledge of disease and therapeutic regimen to maintain health will improve  Short Term Goals: Ability to remain free from injury will improve, Ability to verbalize frustration and anger appropriately will improve, Ability to demonstrate self-control, Ability to participate in decision making will improve, Ability to verbalize feelings will improve, Ability to disclose and discuss suicidal ideas, Ability to identify and develop effective coping behaviors will improve, and Compliance with prescribed medications will improve  Medication Management: RN will administer medications as  ordered by provider, will assess and evaluate patient's response and  provide education to patient for prescribed medication. RN will report any adverse and/or side effects to prescribing provider.  Therapeutic Interventions: 1 on 1 counseling sessions, Psychoeducation, Medication administration, Evaluate responses to treatment, Monitor vital signs and CBGs as ordered, Perform/monitor CIWA, COWS, AIMS and Fall Risk screenings as ordered, Perform wound care treatments as ordered.  Evaluation of Outcomes: Not Met   LCSW Treatment Plan for Primary Diagnosis: Psychosis ([place]) Long Term Goal(s): Safe transition to appropriate next level of care at discharge, Engage patient in therapeutic group addressing interpersonal concerns.  Short Term Goals: Engage patient in aftercare planning with referrals and resources, Increase social support, Increase ability to appropriately verbalize feelings, Increase emotional regulation, Facilitate acceptance of mental health diagnosis and concerns, Facilitate patient progression through stages of change regarding substance use diagnoses and concerns, Identify triggers associated with mental health/substance abuse issues, and Increase skills for wellness and recovery  Therapeutic Interventions: Assess for all discharge needs, 1 to 1 time with Social worker, Explore available resources and support systems, Assess for adequacy in community support network, Educate family and significant other(s) on suicide prevention, Complete Psychosocial Assessment, Interpersonal group therapy.  Evaluation of Outcomes: Not Met   Progress in Treatment: Attending groups: No. Participating in groups: No. Taking medication as prescribed: Yes. Toleration medication: Yes. Family/Significant other contact made: Yes, individual(s) contacted:  mother,  Isidro Monks. Patient understands diagnosis: Yes. Discussing patient identified problems/goals with staff: Yes. Medical problems stabilized or resolved: Yes. Denies suicidal/homicidal ideation:  Yes. Issues/concerns per patient self-inventory: No. Other: none.  New problem(s) identified: No, Describe:  none identified.  New Short Term/Long Term Goal(s): detox, elimination of symptoms of psychosis, medication management for mood stabilization; elimination of SI thoughts; development of comprehensive mental wellness/sobriety plan.  Patient Goals:  "Read. I'd like to just read."  Discharge Plan or Barriers: CSW will assist pt with development of an appropriate aftercare/discharge plan.   Reason for Continuation of Hospitalization: Medication stabilization Other; describe psychosis  Estimated Length of Stay: 1-7 days  Last 3 [place] Suicide Severity Risk Score: Flowsheet Row Admission (Current) from 12/04/2021 in [facility] from 12/03/2021 in [facility] from 12/02/2021 in [facility] No Risk No Risk No Risk       Last PHQ 2/9 Scores:     No data to display          Scribe for Treatment Team: Shirl Harris, LCSW 12/05/2021 10:41 AM

## 2021-12-05 NOTE — Progress Notes (Signed)
Patient ID: Logan Reyes, male   DOB: 2000-09-07, 21 y.o.   MRN: [id number] Follow-up note.  Spoke with the patient's mother on the telephone.  Mother was understandably very anxious and worried about her son's condition.  Spent time explaining the situation and what we have been trying to treat with her son.  Encouraged her to visit and stay in contact with him.  Reassured her that we will try and use the lowest dose of proper medications to get him well and get him home soon.  Reviewed some labs with her.  Patient had given consent for all of this.  Mother sounded relieved to have the information and have contact from the treatment team.

## 2021-12-05 NOTE — Progress Notes (Signed)
Patient has been visible in the milieu but he had minimal interaction with peers and staff on the unit. His mother and sister have called and talked to various staff members about the patient's treatment plan. He was medication compliant on shift, he denies SI or HI but he endorses AVH.

## 2021-12-05 NOTE — Progress Notes (Signed)
[facility] MD Progress Note  12/05/2021 3:01 PM Logan Reyes  MRN:  [id number] Subjective: Follow-up 21 year old man with new onset psychosis.  Patient attended treatment team today.  Disorganized thinking paranoia odd responses to questions.  No violence no report of suicidal ideation Principal Problem: Psychosis (HCC) Diagnosis: Principal Problem:   Psychosis (H[facility]) Active Problems:   Mood disorder (H[facility])  Total Time spent with patient: 30 minutes  Past Psychiatric History: Past history of no previous psychiatric treatment as far as I can tell  Past Medical History: History reviewed. No pertinent past medical history. History reviewed. No pertinent surgical history. Family History: History reviewed. No pertinent family history. Family Psychiatric  History: See previous Social History:  Social History   Substance and Sexual Activity  Alcohol Use Never     Social History   Substance and Sexual Activity  Drug Use Yes   Types: Marijuana    Social History   Socioeconomic History   Marital status: Single    Spouse name: Not on file   Number of children: Not on file   Years of education: Not on file   Highest education level: Not on file  Occupational History   Not on file  Tobacco Use   Smoking status: Never   Smokeless tobacco: Never  Vaping Use   Vaping Use: Never used  Substance and Sexual Activity   Alcohol use: Never   Drug use: Yes    Types: Marijuana   Sexual activity: Not Currently  Other Topics Concern   Not on file  Social History Narrative   Not on file   Social Determinants of Health   Financial Resource Strain: Not on file  Food Insecurity: No Food Insecurity (12/04/2021)   Hunger Vital Sign    Worried About Running Out of Food in the Last Year: Never true    Ran Out of Food in the Last Year: Never true  Transportation Needs: No Transportation Needs (12/04/2021)   PRAPARE - Administrator, Civil Service (Medical): No    Lack of Transportation  (Non-Medical): No  Physical Activity: Not on file  Stress: Not on file  Social Connections: Not on file   Additional Social History:                         Sleep: Fair  Appetite:  Fair  Current Medications: Current Facility-Administered Medications  Medication Dose Route Frequency Provider Last Rate Last Admin   acetaminophen (TYLENOL) tablet 650 mg  650 mg Oral Q6H PRN Gillermo Murdoch, NP   650 mg at 12/05/21 0623   alum & mag hydroxide-simeth (MAALOX/MYLANTA) 200-200-20 MG/5ML suspension 30 mL  30 mL Oral Q4H PRN Gillermo Murdoch, NP       benztropine (COGENTIN) tablet 0.5 mg  0.5 mg Oral QHS Skye Plamondon T, MD   0.5 mg at 12/04/21 2116   hydrOXYzine (ATARAX) tablet 100 mg  100 mg Oral QHS Gillermo Murdoch, NP   100 mg at 12/04/21 2116   magnesium hydroxide (MILK OF MAGNESIA) suspension 30 mL  30 mL Oral Daily PRN Gillermo Murdoch, NP       risperiDONE (RISPERDAL M-TABS) disintegrating tablet 1 mg  1 mg Oral QHS Asher Babilonia, Jackquline Denmark, MD   1 mg at 12/04/21 2116    Lab Results:  Results for orders placed or performed during the hospital encounter of 12/04/21 (from the past 48 hour(s))  Hemoglobin A1c     Status: None   Collection  Time: 12/05/21  6:51 AM  Result Value Ref Range   Hgb A1c MFr Bld 5.0 4.8 - 5.6 %    Comment: (NOTE) Pre diabetes:          5.7%-6.4%  Diabetes:              >6.4%  Glycemic control for   <7.0% adults with diabetes    Mean Plasma Glucose 96.8 mg/dL    Comment: Performed at [facility], [address] [place], [place] [address]  Lipid panel     Status: None   Collection Time: 12/05/21  6:51 AM  Result Value Ref Range   Cholesterol 120 0 - 200 mg/dL   Triglycerides 47 <616 mg/dL   HDL 43 >07 mg/dL   Total CHOL/HDL Ratio 2.8 RATIO   VLDL 9 0 - 40 mg/dL   LDL Cholesterol 68 0 - 99 mg/dL    Comment:        Total Cholesterol/HDL:CHD Risk Coronary Heart Disease Risk Table                     Men   Women  1/2  Average Risk   3.4   3.3  Average Risk       5.0   4.4  2 X Average Risk   9.6   7.1  3 X Average Risk  23.4   11.0        Use the calculated Patient Ratio above and the CHD Risk Table to determine the patient's CHD Risk.        ATP III CLASSIFICATION (LDL):  <100     mg/dL   Optimal  371-062  mg/dL   Near or Above                    Optimal  130-159  mg/dL   Borderline  694-854  mg/dL   High  >627     mg/dL   Very High Performed at [facility], [address] [place], [place] [address]     Blood Alcohol level:  Lab Results  Component Value Date   [facility] <10 12/03/2021    Metabolic Disorder Labs: Lab Results  Component Value Date   HGBA1C 5.0 12/05/2021   MPG 96.8 12/05/2021   No results found for: "PROLACTIN" Lab Results  Component Value Date   CHOL 120 12/05/2021   TRIG 47 12/05/2021   HDL 43 12/05/2021   CHOLHDL 2.8 12/05/2021   VLDL 9 12/05/2021   LDLCALC 68 12/05/2021    Physical Findings: AIMS: Facial and Oral Movements Muscles of Facial Expression: None, normal Lips and Perioral Area: None, normal Jaw: None, normal Tongue: None, normal,Extremity Movements Upper (arms, wrists, hands, fingers): None, normal Lower (legs, knees, ankles, toes): None, normal, Trunk Movements Neck, shoulders, hips: None, normal, Overall Severity Severity of abnormal movements (highest score from questions above): None, normal Incapacitation due to abnormal movements: None, normal Patient's awareness of abnormal movements (rate only patient's report): No Awareness, Dental Status Current problems with teeth and/or dentures?: No Does patient usually wear dentures?: No  CIWA:    COWS:     Musculoskeletal: Strength & Muscle Tone: within normal limits Gait & Station: normal Patient leans: N/A  Psychiatric Specialty Exam:  Presentation  General Appearance: No data recorded Eye Contact:No data recorded Speech:No data recorded Speech Volume:No data  recorded Handedness:No data recorded  Mood and Affect  Mood:No data recorded Affect:No data recorded  Thought Process  Thought Processes:No data recorded Descriptions of Associations:No data recorded Orientation:No data recorded Thought Content:No data recorded History of Schizophrenia/Schizoaffective disorder:No data recorded Duration of Psychotic Symptoms:No data recorded Hallucinations:No data recorded Ideas of Reference:No data recorded Suicidal Thoughts:No data recorded Homicidal Thoughts:No data recorded  Sensorium  Memory:No data recorded Judgment:No data recorded Insight:No data recorded  Executive Functions  Concentration:No data recorded Attention Span:No data recorded Recall:No data recorded Fund of Knowledge:No data recorded Language:No data recorded  Psychomotor Activity  Psychomotor Activity:No data recorded  Assets  Assets:No data recorded  Sleep  Sleep:No data recorded   Physical Exam: Physical Exam Constitutional:      Appearance: Normal appearance.  HENT:     Head: Normocephalic and atraumatic.     Mouth/Throat:     Pharynx: Oropharynx is clear.  Eyes:     Pupils: Pupils are equal, round, and reactive to light.  Cardiovascular:     Rate and Rhythm: Normal rate and regular rhythm.  Pulmonary:     Effort: Pulmonary effort is normal.     Breath sounds: Normal breath sounds.  Abdominal:     General: Abdomen is flat.     Palpations: Abdomen is soft.  Musculoskeletal:        General: Normal range of motion.  Skin:    General: Skin is warm and dry.  Neurological:     General: No focal deficit present.     Mental Status: He is alert. Mental status is at baseline.  Psychiatric:        Attention and Perception: He is inattentive.        Mood and Affect: Mood normal. Affect is blunt.        Speech: Speech is delayed.        Behavior: Behavior is withdrawn.        Thought Content: Thought content normal.        Cognition and Memory:  Cognition is impaired. Memory is impaired.    Review of Systems  Constitutional: Negative.   HENT: Negative.    Eyes: Negative.   Respiratory: Negative.    Cardiovascular: Negative.   Gastrointestinal: Negative.   Musculoskeletal: Negative.   Skin: Negative.   Neurological: Negative.   Psychiatric/Behavioral:  Positive for hallucinations. Negative for depression, substance abuse and suicidal ideas. The patient is nervous/anxious.    Blood pressure (!) 134/97, pulse 89, temperature 98.2 F (36.8 C), temperature source Oral, resp. rate 18, height 5\' 11"  (1.803 m), weight 74.8 kg, SpO2 100 %. Body mass index is 23 kg/m.   Treatment Plan Summary: Medication management and Plan increase dose of Risperdal slightly tonight continue to encourage involvement in individual and group therapy.  Will make contact with family.  , MD 12/05/2021, 3:01 PM

## 2021-12-05 NOTE — [facility] Counselor (Signed)
Adult Comprehensive Assessment  Patient ID: Logan Reyes, male   DOB: 2000/06/19, 21 y.o.   MRN: [id number]  Information Source: Information source: Patient  Current Stressors:  Patient states their primary concerns and needs for treatment are:: "My right here and here", says pt as he points to the wound on the right side of his neck and on the palm of his right hand. Patient states their goals for this hospitilization and ongoing recovery are:: "Social WorkAnimator / Learning stressors: Pt denies Employment / Job issues: Pt is unemployed Family Relationships: Describes relationship with parents as Scientist, research (physical sciences) / Lack of resources (include bankruptcy): Pt denies Housing / Lack of housing: Pt lives with parents Physical health (include injuries & life threatening diseases): Pt denies Social relationships: Pt denies Substance abuse: Pt endorses some marijuana use but does not speak to frequency or amount of use Bereavement / Loss: Pt denies  Living/Environment/Situation:  Living Arrangements: Parent Living conditions (as described by patient or guardian): "It's aight, decent." Who else lives in the home?: Pt lives with his parents How long has patient lived in current situation?: "Since 63" CSW clarified that pt meant since he was 21 years of age. What is atmosphere in current home: Comfortable, Supportive  Family History:  Marital status: Single Are you sexually active?: No What is your sexual orientation?: Heterosexual Has your sexual activity been affected by drugs, alcohol, medication, or emotional stress?: N/A Does patient have children?: No  Childhood History:  By whom was/is the patient raised?: Both parents Additional childhood history information: He describes his childhood as "good". Pt shares he was raised by both parents. Description of patient's relationship with caregiver when they were a child: "It was good." Patient's description of current relationship with  people who raised him/her: "It's rocky from me just staying in the house a lot." How were you disciplined when you got in trouble as a child/adolescent?: "Spanking" Does patient have siblings?: Yes Number of Siblings: 1 (Pt reports a younger sister) Description of patient's current relationship with siblings: "It's good." Did patient suffer any verbal/emotional/physical/sexual abuse as a child?: No Did patient suffer from severe childhood neglect?: No Has patient ever been sexually abused/assaulted/raped as an adolescent or adult?: No Was the patient ever a victim of a crime or a disaster?: No Witnessed domestic violence?: No Has patient been affected by domestic violence as an adult?: No  Education:  Highest grade of school patient has completed: High school graduate Currently a student?: No Learning disability?: No (However, pt reports he had issues with "reading and writing a bit.")  Employment/Work Situation:   Employment Situation: Unemployed Patient's Job has Been Impacted by Current Illness: No What is the Longest Time Patient has Held a Job?: Two months during the summer 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?: No  Financial Resources:   Surveyor, quantity resources: Support from parents / caregiver Does patient have a Lawyer or guardian?: No  Alcohol/Substance Abuse:   What has been your use of drugs/alcohol within the last 12 months?: Pt reports some marijuana use but is not able to speak to frequency of use or amount used. If attempted suicide, did drugs/alcohol play a role in this?:  (N/A) Alcohol/Substance Abuse Treatment Hx: Denies past history If yes, describe treatment: N/A Has alcohol/substance abuse ever caused legal problems?: No  Social Support System:   Patient's Community Support System: Good Describe Community Support System: "My grandma and my mama."  Type of faith/religion: Pt reports he  considers himself Muslim. How does patient's faith help to cope with current illness?: "Just read the bible and stuff, I forgot what it's called." CSW clarified that pt was talking about the Quran as what he forgot was called.  Leisure/Recreation:   Do You Have Hobbies?: Yes Leisure and Hobbies: "Play video games."  Strengths/Needs:   What is the patient's perception of their strengths?: "I can play football, sports." Patient states they can use these personal strengths during their treatment to contribute to their recovery: N/A Patient states these barriers may affect/interfere with their treatment: Pt denies any Patient states these barriers may affect their return to the community: Pt denies any Other important information patient would like considered in planning for their treatment: N/A  Discharge Plan:   Currently receiving community mental health services: No Patient states concerns and preferences for aftercare planning are: He expresses interest in continued medication management and therapy as necessary. Patient states they will know when they are safe and ready for discharge when: Pt agrees that he feels ready to go now. Does patient have access to transportation?: Yes Does patient have financial barriers related to discharge medications?: No Patient description of barriers related to discharge medications: N/A Plan for living situation after discharge: Pt says "might stay at friend's house for a couple days." Will patient be returning to same living situation after discharge?: No  Summary/Recommendations:   Summary and Recommendations (to be completed by the evaluator): Patient is a 21 year old, male from [place], [place] [facility] [place]). He shared that he came to the hospital because of wounds to the right of his neck and on the palm of his right hand. When asked about his goal, he asked what was offered here and after having the programming explained to him, he stated "Social  Work". Pt lives with his parents, is unemployed, and has insurance. Upon discharge, pt stated he would probably go to his friend's house for a couple of days instead of returning to his parents' home. Pt denied any history of abuse, endorses some use of marijuana, and denied any previous history of mental health services. However, pt did agree to have follow up appointment scheduled and completed consent. During the discussion of people he wanted the CSW to be able to talk to he mentioned his mother and grandmother, however, when signing the form, he wrote Ralene Bathe in the signature space. When CSW asked him who this was he said, "it's me." Recommendations include crisis stabilization, therapeutic milieu, encourage group attendance and participation, medication management for mood stabilization and development of comprehensive mental wellness plan.  Glenis Smoker. 12/05/2021

## 2021-12-05 NOTE — Progress Notes (Signed)
Recreation Therapy Notes  INPATIENT RECREATION TR PLAN  Patient Details Name: Logan Reyes MRN: [id number] DOB: [birth year] Today's Date: 12/05/2021  Rec Therapy Plan Is patient appropriate for Therapeutic Recreation?: Yes Treatment times per week: at least 3 Estimated Length of Stay: 5-7 days TR Treatment/Interventions: Group participation (Comment)  Discharge Criteria Pt will be discharged from therapy if:: Discharged Treatment plan/goals/alternatives discussed and agreed upon by:: Patient/family  Discharge Summary     Keianna Signer 12/05/2021, 2:49 PM

## 2021-12-05 NOTE — [facility] Group Notes (Signed)
[facility] Group Notes:  (Nursing/MHT/Case Management/Adjunct)  Date:  12/05/2021  Time:  1:48 PM  Type of Therapy:   Community Meeting  Participation Level:  Did Not Attend   Lynelle Smoke [facility] 12/05/2021, 1:48 PM

## 2021-12-06 NOTE — Progress Notes (Signed)
Patient alert and  oriented x 4 affect is flat, he denies SI/HI/AVH no distress noted interacting appropriately, complaint with medication regimen, 15 minutes safety checks maintained.

## 2021-12-06 NOTE — Progress Notes (Signed)
[facility] MD Progress Note  12/06/2021 9:17 AM Logan Reyes  MRN:  [id number] Subjective:  Patient has been visible in the milieu but he had minimal interaction with peers and staff on the unit. His mother and sister have called and talked to various staff members about the patient's treatment plan. He was medication compliant on shift, he denies SI or HI but he endorses AVH.  Patient evaluated in his room today, where he has been most of the morning. He reports feeling alright, had only 2 hours of sleep last night and denies major issues. He is reporting stable mood, no depression or agitation, is denying current AVH and sounds fairly coherent. He does appear guarded, using short phrases to answer and has some degree of psychomotor retardation.  Principal Problem: Psychosis (HCC) Diagnosis: Principal Problem:   Psychosis (HCC) Active Problems:   Mood disorder (HCC)  Total Time spent with patient: 20 minutes  Past Psychiatric History: None  Past Medical History: History reviewed. No pertinent past medical history. History reviewed. No pertinent surgical history. Family History: History reviewed. No pertinent family history. Family Psychiatric  History: Unknown Social History:  Social History   Substance and Sexual Activity  Alcohol Use Never     Social History   Substance and Sexual Activity  Drug Use Yes   Types: Marijuana    Social History   Socioeconomic History   Marital status: Single    Spouse name: Not on file   Number of children: Not on file   Years of education: Not on file   Highest education level: Not on file  Occupational History   Not on file  Tobacco Use   Smoking status: Never   Smokeless tobacco: Never  Vaping Use   Vaping Use: Never used  Substance and Sexual Activity   Alcohol use: Never   Drug use: Yes    Types: Marijuana   Sexual activity: Not Currently  Other Topics Concern   Not on file  Social History Narrative   Not on file   Social  Determinants of Health   Financial Resource Strain: Not on file  Food Insecurity: No Food Insecurity (12/04/2021)   Hunger Vital Sign    Worried About Running Out of Food in the Last Year: Never true    Ran Out of Food in the Last Year: Never true  Transportation Needs: No Transportation Needs (12/04/2021)   PRAPARE - Administrator, Civil Service (Medical): No    Lack of Transportation (Non-Medical): No  Physical Activity: Not on file  Stress: Not on file  Social Connections: Not on file   Additional Social History:                         Sleep: Poor  Appetite:  Fair  Current Medications: Current Facility-Administered Medications  Medication Dose Route Frequency Provider Last Rate Last Admin   acetaminophen (TYLENOL) tablet 650 mg  650 mg Oral Q6H PRN Gillermo Murdoch, NP   650 mg at 12/05/21 0623   alum & mag hydroxide-simeth (MAALOX/MYLANTA) 200-200-20 MG/5ML suspension 30 mL  30 mL Oral Q4H PRN Gillermo Murdoch, NP       benztropine (COGENTIN) tablet 0.5 mg  0.5 mg Oral QHS Clapacs, John T, MD   0.5 mg at 12/05/21 2131   hydrOXYzine (ATARAX) tablet 100 mg  100 mg Oral QHS Gillermo Murdoch, NP   100 mg at 12/05/21 2131   magnesium hydroxide (MILK OF MAGNESIA) suspension  30 mL  30 mL Oral Daily PRN Gillermo Murdoch, NP       risperiDONE (RISPERDAL M-TABS) disintegrating tablet 1 mg  1 mg Oral QHS Clapacs, John T, MD   1 mg at 12/05/21 2131    Lab Results:  Results for orders placed or performed during the hospital encounter of 12/04/21 (from the past 48 hour(s))  Hemoglobin A1c     Status: None   Collection Time: 12/05/21  6:51 AM  Result Value Ref Range   Hgb A1c MFr Bld 5.0 4.8 - 5.6 %    Comment: (NOTE) Pre diabetes:          5.7%-6.4%  Diabetes:              >6.4%  Glycemic control for   <7.0% adults with diabetes    Mean Plasma Glucose 96.8 mg/dL    Comment: Performed at [facility], [address], [place], [place]  [address]  Lipid panel     Status: None   Collection Time: 12/05/21  6:51 AM  Result Value Ref Range   Cholesterol 120 0 - 200 mg/dL   Triglycerides 47 <527 mg/dL   HDL 43 >78 mg/dL   Total CHOL/HDL Ratio 2.8 RATIO   VLDL 9 0 - 40 mg/dL   LDL Cholesterol 68 0 - 99 mg/dL    Comment:        Total Cholesterol/HDL:CHD Risk Coronary Heart Disease Risk Table                     Men   Women  1/2 Average Risk   3.4   3.3  Average Risk       5.0   4.4  2 X Average Risk   9.6   7.1  3 X Average Risk  23.4   11.0        Use the calculated Patient Ratio above and the CHD Risk Table to determine the patient's CHD Risk.        ATP III CLASSIFICATION (LDL):  <100     mg/dL   Optimal  242-353  mg/dL   Near or Above                    Optimal  130-159  mg/dL   Borderline  614-431  mg/dL   High  >540     mg/dL   Very High Performed at [facility], [address] [place], [place] [address]     Blood Alcohol level:  Lab Results  Component Value Date   [facility] <10 12/03/2021    Metabolic Disorder Labs: Lab Results  Component Value Date   HGBA1C 5.0 12/05/2021   MPG 96.8 12/05/2021   No results found for: "PROLACTIN" Lab Results  Component Value Date   CHOL 120 12/05/2021   TRIG 47 12/05/2021   HDL 43 12/05/2021   CHOLHDL 2.8 12/05/2021   VLDL 9 12/05/2021   LDLCALC 68 12/05/2021    Physical Findings: AIMS: Facial and Oral Movements Muscles of Facial Expression: None, normal Lips and Perioral Area: None, normal Jaw: None, normal Tongue: None, normal,Extremity Movements Upper (arms, wrists, hands, fingers): None, normal Lower (legs, knees, ankles, toes): None, normal, Trunk Movements Neck, shoulders, hips: None, normal, Overall Severity Severity of abnormal movements (highest score from questions above): None, normal Incapacitation due to abnormal movements: None, normal Patient's awareness of abnormal movements (rate only patient's report): No Awareness, Dental  Status Current problems with  teeth and/or dentures?: No Does patient usually wear dentures?: No  CIWA:    COWS:     Musculoskeletal: Strength & Muscle Tone: within normal limits Gait & Station: normal Patient leans: N/A  Psychiatric Specialty Exam:  Presentation  General Appearance: No data recorded Eye Contact:No data recorded Speech:No data recorded Speech Volume:No data recorded Handedness:No data recorded  Mood and Affect  Mood:No data recorded Affect:No data recorded  Thought Process  Thought Processes:No data recorded Descriptions of Associations:No data recorded Orientation:No data recorded Thought Content:No data recorded History of Schizophrenia/Schizoaffective disorder:No data recorded Duration of Psychotic Symptoms:No data recorded Hallucinations:No data recorded Ideas of Reference:No data recorded Suicidal Thoughts:No data recorded Homicidal Thoughts:No data recorded  Sensorium  Memory:No data recorded Judgment:No data recorded Insight:No data recorded  Executive Functions  Concentration:No data recorded Attention Span:No data recorded Recall:No data recorded Fund of Knowledge:No data recorded Language:No data recorded  Psychomotor Activity  Psychomotor Activity:No data recorded  Assets  Assets:No data recorded  Sleep  Sleep:No data recorded   Physical Exam: Physical Exam Constitutional:      Appearance: Normal appearance. He is normal weight.  HENT:     Head: Normocephalic and atraumatic.     Right Ear: Ear canal normal.     Left Ear: Ear canal normal.     Nose: Nose normal.     Mouth/Throat:     Mouth: Mucous membranes are moist.     Pharynx: Oropharynx is clear.  Eyes:     Extraocular Movements: Extraocular movements intact.     Conjunctiva/sclera: Conjunctivae normal.     Pupils: Pupils are equal, round, and reactive to light.  Cardiovascular:     Rate and Rhythm: Normal rate and regular rhythm.     Pulses: Normal pulses.      Heart sounds: Normal heart sounds.  Pulmonary:     Effort: Pulmonary effort is normal.     Breath sounds: Normal breath sounds.  Abdominal:     General: Abdomen is flat. Bowel sounds are normal.     Palpations: Abdomen is soft.  Musculoskeletal:        General: Normal range of motion.     Cervical back: Normal range of motion and neck supple.  Skin:    General: Skin is warm and dry.  Neurological:     General: No focal deficit present.     Mental Status: He is alert and oriented to person, place, and time.  Psychiatric:        Behavior: Behavior normal.    Review of Systems  Constitutional: Negative.   HENT: Negative.    Eyes: Negative.   Respiratory: Negative.    Cardiovascular: Negative.   Gastrointestinal: Negative.   Genitourinary: Negative.   Musculoskeletal: Negative.   Skin: Negative.   Neurological: Negative.   Endo/Heme/Allergies: Negative.   Psychiatric/Behavioral:  Positive for hallucinations. The patient is nervous/anxious.    Blood pressure (!) 144/92, pulse 85, temperature 98.5 F (36.9 C), temperature source Oral, resp. rate 16, height 5\' 11"  (1.803 m), weight 74.8 kg, SpO2 100 %. Body mass index is 23 kg/m.   Treatment Plan Summary: Daily contact with patient to assess and evaluate symptoms and progress in treatment, Medication management, and Plan : Increase Risperdal dose to 2 mg HS.   12/06/2021, 9:17 AM

## 2021-12-06 NOTE — Plan of Care (Signed)
Pt is calm and cooperative, isolative to room reading Bible frequently; Was standing in hallway beside his room holding bag of his belongings early in shift, redirected and returned to room.  Pt affect mainly flat, Denies SI.  He received and readily took phone calls from multiple family members during shift.  Patient remained isolated in his room reading most of the day, remaining calm and cooperative.  Safety was maintained; continued 15 minute checks.

## 2021-12-07 MED ORDER — RISPERIDONE 1 MG PO TBDP
2.0000 mg | ORAL_TABLET | Freq: Every day | ORAL | Status: DC
  Administered 2021-12-07 – 2021-12-08 (×2): 2 mg via ORAL
  Filled 2021-12-07 (×2): qty 2

## 2021-12-07 NOTE — Group Note (Signed)
[facility] LCSW Group Therapy Note   Group Date: 12/07/2021 Start Time: 1200 End Time: 1300   Type of Therapy/Topic:  Group Therapy:  Emotion Regulation  Participation Level:  Did Not Attend   Mood:  Description of Group:    The purpose of this group is to assist patients in learning to regulate negative emotions and experience positive emotions. Patients will be guided to discuss ways in which they have been vulnerable to their negative emotions. These vulnerabilities will be juxtaposed with experiences of positive emotions or situations, and patients challenged to use positive emotions to combat negative ones. Special emphasis will be placed on coping with negative emotions in conflict situations, and patients will process healthy conflict resolution skills.  Therapeutic Goals: Patient will identify two positive emotions or experiences to reflect on in order to balance out negative emotions:  Patient will label two or more emotions that they find the most difficult to experience:  Patient will be able to demonstrate positive conflict resolution skills through discussion or role plays:   Summary of Patient Progress: Patient did not attend group despite encouraged participation.      Therapeutic Modalities:   Cognitive Behavioral Therapy Feelings Identification Dialectical Behavioral Therapy   Kiosha Buchan W Kyran Connaughton, LCSWA 

## 2021-12-07 NOTE — Progress Notes (Signed)
[facility] MD Progress Note  12/07/2021 8:36 AM Logan Reyes  MRN:  [id number] Subjective:  Pt is calm and cooperative, isolative to room reading Bible frequently; Was standing in hallway beside his room holding bag of his belongings early in shift, redirected and returned to room.  Pt affect mainly flat, Denies SI.  He received and readily took phone calls from multiple family members during shift.  Patient remained isolated in his room reading most of the day, remaining calm and cooperative.    Patient reports feeling alright today; he remains in his room, laying in bed most of the time but does come out for meals and meds. He slept well last night, seems to have some psychomotor retardation and religious preoccupation. He is denying any SI/HI or AVH, is guarded and not very talkative.  Principal Problem: Psychosis (HCC) Diagnosis: Principal Problem:   Psychosis (HCC) Active Problems:   Mood disorder (HCC)  Total Time spent with patient: 20 minutes  Past Psychiatric History: None  Past Medical History: History reviewed. No pertinent past medical history. History reviewed. No pertinent surgical history. Family History: History reviewed. No pertinent family history. Family Psychiatric  History: None Social History:  Social History   Substance and Sexual Activity  Alcohol Use Never     Social History   Substance and Sexual Activity  Drug Use Yes   Types: Marijuana    Social History   Socioeconomic History   Marital status: Single    Spouse name: Not on file   Number of children: Not on file   Years of education: Not on file   Highest education level: Not on file  Occupational History   Not on file  Tobacco Use   Smoking status: Never   Smokeless tobacco: Never  Vaping Use   Vaping Use: Never used  Substance and Sexual Activity   Alcohol use: Never   Drug use: Yes    Types: Marijuana   Sexual activity: Not Currently  Other Topics Concern   Not on file  Social History  Narrative   Not on file   Social Determinants of Health   Financial Resource Strain: Not on file  Food Insecurity: No Food Insecurity (12/04/2021)   Hunger Vital Sign    Worried About Running Out of Food in the Last Year: Never true    Ran Out of Food in the Last Year: Never true  Transportation Needs: No Transportation Needs (12/04/2021)   PRAPARE - Administrator, Civil Service (Medical): No    Lack of Transportation (Non-Medical): No  Physical Activity: Not on file  Stress: Not on file  Social Connections: Not on file   Additional Social History:                         Sleep: Good  Appetite:  Good  Current Medications: Current Facility-Administered Medications  Medication Dose Route Frequency Provider Last Rate Last Admin   acetaminophen (TYLENOL) tablet 650 mg  650 mg Oral Q6H PRN Gillermo Murdoch, NP   650 mg at 12/05/21 0623   alum & mag hydroxide-simeth (MAALOX/MYLANTA) 200-200-20 MG/5ML suspension 30 mL  30 mL Oral Q4H PRN Gillermo Murdoch, NP       benztropine (COGENTIN) tablet 0.5 mg  0.5 mg Oral QHS Clapacs, John T, MD   0.5 mg at 12/06/21 2124   hydrOXYzine (ATARAX) tablet 100 mg  100 mg Oral QHS Gillermo Murdoch, NP   100 mg at 12/06/21 2124  magnesium hydroxide (MILK OF MAGNESIA) suspension 30 mL  30 mL Oral Daily PRN Gillermo Murdoch, NP       risperiDONE (RISPERDAL M-TABS) disintegrating tablet 1 mg  1 mg Oral QHS Clapacs, Jackquline Denmark, MD   1 mg at 12/06/21 2124    Lab Results: No results found for this or any previous visit (from the past 48 hour(s)).  Blood Alcohol level:  Lab Results  Component Value Date   ETH <10 12/03/2021    Metabolic Disorder Labs: Lab Results  Component Value Date   HGBA1C 5.0 12/05/2021   MPG 96.8 12/05/2021   No results found for: "PROLACTIN" Lab Results  Component Value Date   CHOL 120 12/05/2021   TRIG 47 12/05/2021   HDL 43 12/05/2021   CHOLHDL 2.8 12/05/2021   VLDL 9 12/05/2021    LDLCALC 68 12/05/2021    Physical Findings: AIMS: Facial and Oral Movements Muscles of Facial Expression: None, normal Lips and Perioral Area: None, normal Jaw: None, normal Tongue: None, normal,Extremity Movements Upper (arms, wrists, hands, fingers): None, normal Lower (legs, knees, ankles, toes): None, normal, Trunk Movements Neck, shoulders, hips: None, normal, Overall Severity Severity of abnormal movements (highest score from questions above): None, normal Incapacitation due to abnormal movements: None, normal Patient's awareness of abnormal movements (rate only patient's report): No Awareness, Dental Status Current problems with teeth and/or dentures?: No Does patient usually wear dentures?: No  CIWA:    COWS:     Musculoskeletal: Strength & Muscle Tone: within normal limits Gait & Station: normal Patient leans: Right  Psychiatric Specialty Exam:  Presentation  General Appearance: No data recorded Eye Contact:No data recorded Speech:No data recorded Speech Volume:No data recorded Handedness:No data recorded  Mood and Affect  Mood:No data recorded Affect:No data recorded  Thought Process  Thought Processes:No data recorded Descriptions of Associations:No data recorded Orientation:No data recorded Thought Content:No data recorded History of Schizophrenia/Schizoaffective disorder:No data recorded Duration of Psychotic Symptoms:No data recorded Hallucinations:No data recorded Ideas of Reference:No data recorded Suicidal Thoughts:No data recorded Homicidal Thoughts:No data recorded  Sensorium  Memory:No data recorded Judgment:No data recorded Insight:No data recorded  Executive Functions  Concentration:No data recorded Attention Span:No data recorded Recall:No data recorded Fund of Knowledge:No data recorded Language:No data recorded  Psychomotor Activity  Psychomotor Activity:No data recorded  Assets  Assets:No data recorded  Sleep  Sleep:No  data recorded   Physical Exam: Physical Exam ROS Blood pressure (!) 138/102, pulse (!) 101, temperature 98.6 F (37 C), temperature source Oral, resp. rate 18, height 5\' 11"  (1.803 m), weight 74.8 kg, SpO2 100 %. Body mass index is 23 kg/m.   Treatment Plan Summary: Daily contact with patient to assess and evaluate symptoms and progress in treatment, Medication management, and Plan : Increase Risperdal to 2 mg daily.   12/07/2021, 8:36 AM

## 2021-12-07 NOTE — Plan of Care (Addendum)
Patient isolates to his room sitting on the bed going through the activity book most of the afternoon. Patient noted responding to internal stimuli.Patient states that he is feeling good, no concerns verbalized. Denies SI,HI and AVH. Patient visible in the milieu for meals and minimal interactions with staff & peers. Appetite and energy level good. Support and encouragement given.

## 2021-12-08 DIAGNOSIS — F29 Unspecified psychosis not due to a substance or known physiological condition: Secondary | ICD-10-CM | POA: Diagnosis not present

## 2021-12-08 NOTE — Group Note (Signed)
[facility] LCSW Group Therapy Note    Group Date: 12/08/2021 Start Time: 1300 End Time: 1400  Type of Therapy and Topic:  Group Therapy:  Overcoming Obstacles  Participation Level:  [facility] PARTICIPATION LEVEL: Did Not Attend   Description of Group:   In this group patients will be encouraged to explore what they see as obstacles to their own wellness and recovery. They will be guided to discuss their thoughts, feelings, and behaviors related to these obstacles. The group will process together ways to cope with barriers, with attention given to specific choices patients can make. Each patient will be challenged to identify changes they are motivated to make in order to overcome their obstacles. This group will be process-oriented, with patients participating in exploration of their own experiences as well as giving and receiving support and challenge from other group members.  Therapeutic Goals: 1. Patient will identify personal and current obstacles as they relate to admission. 2. Patient will identify barriers that currently interfere with their wellness or overcoming obstacles.  3. Patient will identify feelings, thought process and behaviors related to these barriers. 4. Patient will identify two changes they are willing to make to overcome these obstacles:    Summary of Patient Progress X   Therapeutic Modalities:   Cognitive Behavioral Therapy Solution Focused Therapy Motivational Interviewing Relapse Prevention Therapy   Logan Reyes [name], LCSW 

## 2021-12-08 NOTE — [facility] Group Notes (Signed)
[facility] Group Notes:  (Nursing/MHT/Case Management/Adjunct)  Date:  12/08/2021  Time:  10:58 PM  Type of Therapy:  Group Therapy Wrap up   Participation Level:  Did Not Attend   Maglione,Boyce Keltner E 12/08/2021, 10:58 PM

## 2021-12-08 NOTE — Plan of Care (Signed)
D- Patient alert and oriented. Patient presented in a pleasant mood on assessment, reading quietly in his room. Patient stated that he slept "alright" last night and had no complaints to voice to this Clinical research associate. Patient denied depression, however, endorsed "a little bit" of anxiety, stating "just being in here", is why he's feeling this way. Patient also denied SI, HI, AVH, and pain at this time. Patient had no stated goals for today.  A- Support and encouragement provided.  Routine safety checks conducted every 15 minutes. Patient informed to notify staff with problems or concerns.  R- Patient contracts for safety at this time. Patient receptive, calm, and cooperative. Patient isolates to room, except for meals. Patient remains safe at this time.  Problem: Education: Goal: Knowledge of General Education information will improve Description: Including pain rating scale, medication(s)/side effects and non-pharmacologic comfort measures Outcome: Progressing   Problem: Health Behavior/Discharge Planning: Goal: Ability to manage health-related needs will improve Outcome: Progressing   Problem: Clinical Measurements: Goal: Ability to maintain clinical measurements within normal limits will improve Outcome: Progressing Goal: Will remain free from infection Outcome: Progressing Goal: Diagnostic test results will improve Outcome: Progressing Goal: Respiratory complications will improve Outcome: Progressing Goal: Cardiovascular complication will be avoided Outcome: Progressing   Problem: Activity: Goal: Risk for activity intolerance will decrease Outcome: Progressing   Problem: Nutrition: Goal: Adequate nutrition will be maintained Outcome: Progressing   Problem: Coping: Goal: Level of anxiety will decrease Outcome: Progressing   Problem: Elimination: Goal: Will not experience complications related to bowel motility Outcome: Progressing Goal: Will not experience complications related to  urinary retention Outcome: Progressing   Problem: Pain Managment: Goal: General experience of comfort will improve Outcome: Progressing   Problem: Safety: Goal: Ability to remain free from injury will improve Outcome: Progressing   Problem: Skin Integrity: Goal: Risk for impaired skin integrity will decrease Outcome: Progressing   Problem: Activity: Goal: Will verbalize the importance of balancing activity with adequate rest periods Outcome: Progressing   Problem: Education: Goal: Will be free of psychotic symptoms Outcome: Progressing Goal: Knowledge of the prescribed therapeutic regimen will improve Outcome: Progressing   Problem: Coping: Goal: Coping ability will improve Outcome: Progressing Goal: Will verbalize feelings Outcome: Progressing   Problem: Health Behavior/Discharge Planning: Goal: Compliance with prescribed medication regimen will improve Outcome: Progressing   Problem: Nutritional: Goal: Ability to achieve adequate nutritional intake will improve Outcome: Progressing   Problem: Role Relationship: Goal: Ability to communicate needs accurately will improve Outcome: Progressing Goal: Ability to interact with others will improve Outcome: Progressing   Problem: Safety: Goal: Ability to redirect hostility and anger into socially appropriate behaviors will improve Outcome: Progressing Goal: Ability to remain free from injury will improve Outcome: Progressing   Problem: Self-Care: Goal: Ability to participate in self-care as condition permits will improve Outcome: Progressing   Problem: Self-Concept: Goal: Will verbalize positive feelings about self Outcome: Progressing

## 2021-12-08 NOTE — Progress Notes (Signed)
Recreation Therapy Notes  Date: 12/08/2021  Time: 10:30 am   Location: Craft room       Behavioral response: N/A   Intervention Topic: Problem- Solving    Discussion/Intervention: Patient did not attend group.   Clinical Observations/Feedback:  Patient did not attend group.   Keyonni Percival LRT/CTRS        Elenora Hawbaker 12/08/2021 12:27 PM

## 2021-12-08 NOTE — Progress Notes (Signed)
Patient alert and  oriented x 4 affect is flat, but brightens upon approach, he denies SI/HI/AVH but appears responding to internal stimuli. No distress noted interacting appropriately with staff and complaint with medication regimen, 15 minutes safety checks maintained

## 2021-12-08 NOTE — Progress Notes (Signed)
[facility] MD Progress Note  12/08/2021 3:23 PM Logan Reyes  MRN:  [id number] Subjective: Follow-up patient with what sounds like early onset of schizophrenia.  Patient vague in his responses.  Said that he felt like he needed "more medicine" but was not able to be very clear about what that would be treating.  He denies that he is having any hallucinations denies any suicidal thoughts.  He does say that he would like to be able to talk more and be able to interact better with people.  Encourage group attendance with him.  Not having any complaints from medicine Principal Problem: Psychosis (HCC) Diagnosis: Principal Problem:   Psychosis (HCC) Active Problems:   Mood disorder (HCC)  Total Time spent with patient: 30 minutes  Past Psychiatric History: Past history really nothing  Past Medical History: History reviewed. No pertinent past medical history. History reviewed. No pertinent surgical history. Family History: History reviewed. No pertinent family history. Family Psychiatric  History: See previous Social History:  Social History   Substance and Sexual Activity  Alcohol Use Never     Social History   Substance and Sexual Activity  Drug Use Yes   Types: Marijuana    Social History   Socioeconomic History   Marital status: Single    Spouse name: Not on file   Number of children: Not on file   Years of education: Not on file   Highest education level: Not on file  Occupational History   Not on file  Tobacco Use   Smoking status: Never   Smokeless tobacco: Never  Vaping Use   Vaping Use: Never used  Substance and Sexual Activity   Alcohol use: Never   Drug use: Yes    Types: Marijuana   Sexual activity: Not Currently  Other Topics Concern   Not on file  Social History Narrative   Not on file   Social Determinants of Health   Financial Resource Strain: Not on file  Food Insecurity: No Food Insecurity (12/04/2021)   Hunger Vital Sign    Worried About Running Out of  Food in the Last Year: Never true    Ran Out of Food in the Last Year: Never true  Transportation Needs: No Transportation Needs (12/04/2021)   PRAPARE - Administrator, Civil Service (Medical): No    Lack of Transportation (Non-Medical): No  Physical Activity: Not on file  Stress: Not on file  Social Connections: Not on file   Additional Social History:                         Sleep: Fair  Appetite:  Fair  Current Medications: Current Facility-Administered Medications  Medication Dose Route Frequency Provider Last Rate Last Admin   acetaminophen (TYLENOL) tablet 650 mg  650 mg Oral Q6H PRN Gillermo Murdoch, NP   650 mg at 12/05/21 0623   alum & mag hydroxide-simeth (MAALOX/MYLANTA) 200-200-20 MG/5ML suspension 30 mL  30 mL Oral Q4H PRN Gillermo Murdoch, NP       benztropine (COGENTIN) tablet 0.5 mg  0.5 mg Oral QHS Boby Eyer T, MD   0.5 mg at 12/07/21 2141   hydrOXYzine (ATARAX) tablet 100 mg  100 mg Oral QHS Gillermo Murdoch, NP   100 mg at 12/07/21 2141   magnesium hydroxide (MILK OF MAGNESIA) suspension 30 mL  30 mL Oral Daily PRN Gillermo Murdoch, NP       risperiDONE (RISPERDAL M-TABS) disintegrating tablet 2 mg  2 mg Oral QHS Malik, Kashif   2 mg at 12/07/21 2141    Lab Results: No results found for this or any previous visit (from the past 48 hour(s)).  Blood Alcohol level:  Lab Results  Component Value Date   ETH <10 12/03/2021    Metabolic Disorder Labs: Lab Results  Component Value Date   HGBA1C 5.0 12/05/2021   MPG 96.8 12/05/2021   No results found for: "PROLACTIN" Lab Results  Component Value Date   CHOL 120 12/05/2021   TRIG 47 12/05/2021   HDL 43 12/05/2021   CHOLHDL 2.8 12/05/2021   VLDL 9 12/05/2021   LDLCALC 68 12/05/2021    Physical Findings: AIMS: Facial and Oral Movements Muscles of Facial Expression: None, normal Lips and Perioral Area: None, normal Jaw: None, normal Tongue: None, normal,Extremity  Movements Upper (arms, wrists, hands, fingers): None, normal Lower (legs, knees, ankles, toes): None, normal, Trunk Movements Neck, shoulders, hips: None, normal, Overall Severity Severity of abnormal movements (highest score from questions above): None, normal Incapacitation due to abnormal movements: None, normal Patient's awareness of abnormal movements (rate only patient's report): No Awareness, Dental Status Current problems with teeth and/or dentures?: No Does patient usually wear dentures?: No  CIWA:    COWS:     Musculoskeletal: Strength & Muscle Tone: within normal limits Gait & Station: normal Patient leans: N/A  Psychiatric Specialty Exam:  Presentation  General Appearance: Appropriate for Environment; Casual; Neat  Eye Contact:Fair  Speech:Slow  Speech Volume:Decreased  Handedness:Right   Mood and Affect  Mood:Anxious  Affect:Blunt   Thought Process  Thought Processes:Linear  Descriptions of Associations:Circumstantial  Orientation:Full (Time, Place and Person)  Thought Content:Logical  History of Schizophrenia/Schizoaffective disorder:No data recorded Duration of Psychotic Symptoms:No data recorded Hallucinations:Hallucinations: None  Ideas of Reference:Paranoia  Suicidal Thoughts:Suicidal Thoughts: No  Homicidal Thoughts:Homicidal Thoughts: No   Sensorium  Memory:Immediate Fair; Immediate Good  Judgment:Fair  Insight:Fair   Executive Functions  Concentration:Fair  Attention Span:Fair  Recall:Fair  Fund of Knowledge:Fair  Language:Fair   Psychomotor Activity  Psychomotor Activity:Psychomotor Activity: Psychomotor Retardation   Assets  Assets:Communication Skills; Desire for Improvement; Financial Resources/Insurance; Housing; Social Support   Sleep  Sleep:Sleep: Fair    Physical Exam: Physical Exam Vitals and nursing note reviewed.  Constitutional:      Appearance: Normal appearance.  HENT:     Head:  Normocephalic and atraumatic.     Mouth/Throat:     Pharynx: Oropharynx is clear.  Eyes:     Pupils: Pupils are equal, round, and reactive to light.  Cardiovascular:     Rate and Rhythm: Normal rate and regular rhythm.  Pulmonary:     Effort: Pulmonary effort is normal.     Breath sounds: Normal breath sounds.  Abdominal:     General: Abdomen is flat.     Palpations: Abdomen is soft.  Musculoskeletal:        General: Normal range of motion.  Skin:    General: Skin is warm and dry.  Neurological:     General: No focal deficit present.     Mental Status: He is alert. Mental status is at baseline.  Psychiatric:        Attention and Perception: He is inattentive. He does not perceive auditory hallucinations.        Mood and Affect: Mood normal. Affect is blunt.        Speech: Speech is delayed.        Behavior: Behavior is slowed.  Thought Content: Thought content normal.    Review of Systems  Constitutional: Negative.   HENT: Negative.    Eyes: Negative.   Respiratory: Negative.    Cardiovascular: Negative.   Gastrointestinal: Negative.   Musculoskeletal: Negative.   Skin: Negative.   Neurological: Negative.   Psychiatric/Behavioral:  Negative for depression, hallucinations, substance abuse and suicidal ideas. The patient is not nervous/anxious.    Blood pressure (!) 131/107, pulse 72, temperature 97.8 F (36.6 C), temperature source Oral, resp. rate 18, height 5\' 11"  (1.803 m), weight 74.8 kg, SpO2 100 %. Body mass index is 23 kg/m.   Treatment Plan Summary: Medication management and Plan doing pretty well with the low-dose Risperdal.  No clear indication to change anything.  I will be giving his mother a call to talk about further plans.  I would anticipate 1-2 more days before we will be likely able to discharge him  , MD 12/08/2021, 3:23 PM

## 2021-12-09 DIAGNOSIS — F29 Unspecified psychosis not due to a substance or known physiological condition: Secondary | ICD-10-CM | POA: Diagnosis not present

## 2021-12-09 MED ORDER — RISPERIDONE 1 MG PO TBDP
3.0000 mg | ORAL_TABLET | Freq: Every day | ORAL | Status: DC
  Administered 2021-12-09 – 2021-12-10 (×2): 3 mg via ORAL
  Filled 2021-12-09 (×2): qty 3

## 2021-12-09 NOTE — Group Note (Signed)
[facility] LCSW Group Therapy Note   Group Date: 12/09/2021 Start Time: 1300 End Time: 1400  Type of Therapy/Topic:  Group Therapy:  Feelings about Diagnosis  Participation Level:  Did Not Attend    Description of Group:    This group will allow patients to explore their thoughts and feelings about diagnoses they have received. Patients will be guided to explore their level of understanding and acceptance of these diagnoses. Facilitator will encourage patients to process their thoughts and feelings about the reactions of others to their diagnosis, and will guide patients in identifying ways to discuss their diagnosis with significant others in their lives. This group will be process-oriented, with patients participating in exploration of their own experiences as well as giving and receiving support and challenge from other group members.   Therapeutic Goals: 1. Patient will demonstrate understanding of diagnosis as evidence by identifying two or more symptoms of the disorder:  2. Patient will be able to express two feelings regarding the diagnosis 3. Patient will demonstrate ability to communicate their needs through discussion and/or role plays  Summary of Patient Progress: X   Therapeutic Modalities:   Cognitive Behavioral Therapy Brief Therapy Feelings Identification    Brion Sossamon R Vihana Kydd, LCSW 

## 2021-12-09 NOTE — [facility] Group Notes (Signed)
[facility] Group Notes:  (Nursing/MHT/Case Management/Adjunct)  Date:  12/09/2021  Time:  9:53 AM  Type of Therapy:   community meeting  Participation Level:  Did Not Attend    Rodena Goldmann 12/09/2021, 9:53 AM

## 2021-12-09 NOTE — Plan of Care (Signed)
D- Patient alert and oriented. Patient presented in a pleasant mood on assessment stating that he slept fair last night and had no complaints to voice to this Clinical research associate. Patient denied depression, but endorsed both hopelessness and anxiety on his self-inventory, stating that being here is why he feels this way. Patient denied SI, HI, AVH, and pain at this time. Per his self-inventory, patient's goal for today is "working a round".  A- Support and encouragement provided. Routine safety checks conducted every 15 minutes. Patient informed to notify staff with problems or concerns.  R- Patient contracts for safety at this time. Patient compliant with medications and treatment plan. Patient receptive, calm, and cooperative. Patient isolates to room, except for meals. Patient remains safe at this time.  Problem: Education: Goal: Knowledge of General Education information will improve Description: Including pain rating scale, medication(s)/side effects and non-pharmacologic comfort measures Outcome: Progressing   Problem: Health Behavior/Discharge Planning: Goal: Ability to manage health-related needs will improve Outcome: Progressing   Problem: Clinical Measurements: Goal: Ability to maintain clinical measurements within normal limits will improve Outcome: Progressing Goal: Will remain free from infection Outcome: Progressing Goal: Diagnostic test results will improve Outcome: Progressing Goal: Respiratory complications will improve Outcome: Progressing Goal: Cardiovascular complication will be avoided Outcome: Progressing   Problem: Activity: Goal: Risk for activity intolerance will decrease Outcome: Progressing   Problem: Nutrition: Goal: Adequate nutrition will be maintained Outcome: Progressing   Problem: Coping: Goal: Level of anxiety will decrease Outcome: Progressing   Problem: Elimination: Goal: Will not experience complications related to bowel motility Outcome:  Progressing Goal: Will not experience complications related to urinary retention Outcome: Progressing   Problem: Pain Managment: Goal: General experience of comfort will improve Outcome: Progressing   Problem: Safety: Goal: Ability to remain free from injury will improve Outcome: Progressing   Problem: Skin Integrity: Goal: Risk for impaired skin integrity will decrease Outcome: Progressing   Problem: Activity: Goal: Will verbalize the importance of balancing activity with adequate rest periods Outcome: Progressing   Problem: Education: Goal: Will be free of psychotic symptoms Outcome: Progressing Goal: Knowledge of the prescribed therapeutic regimen will improve Outcome: Progressing   Problem: Coping: Goal: Coping ability will improve Outcome: Progressing Goal: Will verbalize feelings Outcome: Progressing   Problem: Health Behavior/Discharge Planning: Goal: Compliance with prescribed medication regimen will improve Outcome: Progressing   Problem: Nutritional: Goal: Ability to achieve adequate nutritional intake will improve Outcome: Progressing   Problem: Role Relationship: Goal: Ability to communicate needs accurately will improve Outcome: Progressing Goal: Ability to interact with others will improve Outcome: Progressing   Problem: Safety: Goal: Ability to redirect hostility and anger into socially appropriate behaviors will improve Outcome: Progressing Goal: Ability to remain free from injury will improve Outcome: Progressing   Problem: Self-Care: Goal: Ability to participate in self-care as condition permits will improve Outcome: Progressing   Problem: Self-Concept: Goal: Will verbalize positive feelings about self Outcome: Progressing

## 2021-12-09 NOTE — Progress Notes (Signed)
Pt presents with a better affect than when this writer had him previously this admission. Pt minimal with staff and peers and isolates to his room. Pt compliant with medication administration per MD orders. Pt given education, support and encouragement to be active in his treatment plan. Pt being monitored Q 15 minutes for safety per unit protocol, remains safe on the unit

## 2021-12-09 NOTE — Progress Notes (Signed)
Recreation Therapy Notes  Date: 12/09/2021  Time: 10:30 am   Location: Courtyard     Behavioral response: N/A   Intervention Topic: Wellness   Discussion/Intervention: Patient did not attend group.   Clinical Observations/Feedback:  Patient did not attend group.   Nik Gorrell LRT/CTRS         Logan Reyes 12/09/2021 1:02 PM

## 2021-12-09 NOTE — Progress Notes (Signed)
[facility] MD Progress Note  12/09/2021 5:10 PM Logan Reyes  MRN:  [id number] Subjective: Follow-up 21 year old man with psychotic symptoms.  Stays mostly isolated.  Engages in conversation when asked to but provides little information mostly thought blocked or answering in single words.  Only thing he can come up with as things he might need are "medicine".  Denies suicidal ideation.  Appears to be tolerating medicine well. Principal Problem: Psychosis (HCC) Diagnosis: Principal Problem:   Psychosis (HCC) Active Problems:   Mood disorder (HCC)  Total Time spent with patient: 30 minutes  Past Psychiatric History: No past psychiatric history  Past Medical History: History reviewed. No pertinent past medical history. History reviewed. No pertinent surgical history. Family History: History reviewed. No pertinent family history. Family Psychiatric  History: See previous Social History:  Social History   Substance and Sexual Activity  Alcohol Use Never     Social History   Substance and Sexual Activity  Drug Use Yes   Types: Marijuana    Social History   Socioeconomic History   Marital status: Single    Spouse name: Not on file   Number of children: Not on file   Years of education: Not on file   Highest education level: Not on file  Occupational History   Not on file  Tobacco Use   Smoking status: Never   Smokeless tobacco: Never  Vaping Use   Vaping Use: Never used  Substance and Sexual Activity   Alcohol use: Never   Drug use: Yes    Types: Marijuana   Sexual activity: Not Currently  Other Topics Concern   Not on file  Social History Narrative   Not on file   Social Determinants of Health   Financial Resource Strain: Not on file  Food Insecurity: No Food Insecurity (12/04/2021)   Hunger Vital Sign    Worried About Running Out of Food in the Last Year: Never true    Ran Out of Food in the Last Year: Never true  Transportation Needs: No Transportation Needs  (12/04/2021)   PRAPARE - Administrator, Civil Service (Medical): No    Lack of Transportation (Non-Medical): No  Physical Activity: Not on file  Stress: Not on file  Social Connections: Not on file   Additional Social History:                         Sleep: Fair  Appetite:  Fair  Current Medications: Current Facility-Administered Medications  Medication Dose Route Frequency Provider Last Rate Last Admin   acetaminophen (TYLENOL) tablet 650 mg  650 mg Oral Q6H PRN Gillermo Murdoch, NP   650 mg at 12/05/21 0623   alum & mag hydroxide-simeth (MAALOX/MYLANTA) 200-200-20 MG/5ML suspension 30 mL  30 mL Oral Q4H PRN Gillermo Murdoch, NP       benztropine (COGENTIN) tablet 0.5 mg  0.5 mg Oral QHS Mattilynn Forrer T, MD   0.5 mg at 12/08/21 2115   hydrOXYzine (ATARAX) tablet 100 mg  100 mg Oral QHS Gillermo Murdoch, NP   100 mg at 12/08/21 2115   magnesium hydroxide (MILK OF MAGNESIA) suspension 30 mL  30 mL Oral Daily PRN Gillermo Murdoch, NP       risperiDONE (RISPERDAL M-TABS) disintegrating tablet 3 mg  3 mg Oral QHS Zuriel Roskos T, MD        Lab Results: No results found for this or any previous visit (from the past 48 hour(s)).  Blood  Alcohol level:  Lab Results  Component Value Date   ETH <10 12/03/2021    Metabolic Disorder Labs: Lab Results  Component Value Date   HGBA1C 5.0 12/05/2021   MPG 96.8 12/05/2021   No results found for: "PROLACTIN" Lab Results  Component Value Date   CHOL 120 12/05/2021   TRIG 47 12/05/2021   HDL 43 12/05/2021   CHOLHDL 2.8 12/05/2021   VLDL 9 12/05/2021   LDLCALC 68 12/05/2021    Physical Findings: AIMS: Facial and Oral Movements Muscles of Facial Expression: None, normal Lips and Perioral Area: None, normal Jaw: None, normal Tongue: None, normal,Extremity Movements Upper (arms, wrists, hands, fingers): None, normal Lower (legs, knees, ankles, toes): None, normal, Trunk Movements Neck, shoulders,  hips: None, normal, Overall Severity Severity of abnormal movements (highest score from questions above): None, normal Incapacitation due to abnormal movements: None, normal Patient's awareness of abnormal movements (rate only patient's report): No Awareness, Dental Status Current problems with teeth and/or dentures?: No Does patient usually wear dentures?: No  CIWA:    COWS:     Musculoskeletal: Strength & Muscle Tone: within normal limits Gait & Station: normal Patient leans: N/A  Psychiatric Specialty Exam:  Presentation  General Appearance: Appropriate for Environment; Casual; Neat  Eye Contact:Fair  Speech:Slow  Speech Volume:Decreased  Handedness:Right   Mood and Affect  Mood:Anxious  Affect:Blunt   Thought Process  Thought Processes:Linear  Descriptions of Associations:Circumstantial  Orientation:Full (Time, Place and Person)  Thought Content:Logical  History of Schizophrenia/Schizoaffective disorder:No data recorded Duration of Psychotic Symptoms:No data recorded Hallucinations:No data recorded Ideas of Reference:Paranoia  Suicidal Thoughts:No data recorded Homicidal Thoughts:No data recorded  Sensorium  Memory:Immediate Fair; Immediate Good  Judgment:Fair  Insight:Fair   Executive Functions  Concentration:Fair  Attention Span:Fair  Recall:Fair  Fund of Knowledge:Fair  Language:Fair   Psychomotor Activity  Psychomotor Activity:No data recorded  Assets  Assets:Communication Skills; Desire for Improvement; Financial Resources/Insurance; Housing; Social Support   Sleep  Sleep:No data recorded   Physical Exam: Physical Exam Vitals and nursing note reviewed.  Constitutional:      Appearance: Normal appearance.  HENT:     Head: Normocephalic and atraumatic.     Mouth/Throat:     Pharynx: Oropharynx is clear.  Eyes:     Pupils: Pupils are equal, round, and reactive to light.  Cardiovascular:     Rate and Rhythm: Normal  rate and regular rhythm.  Pulmonary:     Effort: Pulmonary effort is normal.     Breath sounds: Normal breath sounds.  Abdominal:     General: Abdomen is flat.     Palpations: Abdomen is soft.  Musculoskeletal:        General: Normal range of motion.  Skin:    General: Skin is warm and dry.  Neurological:     General: No focal deficit present.     Mental Status: He is alert. Mental status is at baseline.  Psychiatric:        Attention and Perception: He is inattentive.        Mood and Affect: Mood normal. Affect is blunt.        Speech: He is noncommunicative.        Behavior: Behavior is slowed.        Thought Content: Thought content normal.        Cognition and Memory: Cognition is impaired.    Review of Systems  Constitutional: Negative.   HENT: Negative.    Eyes: Negative.   Respiratory: Negative.  Cardiovascular: Negative.   Gastrointestinal: Negative.   Musculoskeletal: Negative.   Skin: Negative.   Neurological: Negative.   Psychiatric/Behavioral:  The patient is nervous/anxious.    Blood pressure (!) 124/97, pulse 96, temperature 97.8 F (36.6 C), temperature source Oral, resp. rate 20, height 5\' 11"  (1.803 m), weight 74.8 kg, SpO2 100 %. Body mass index is 23 kg/m.   Treatment Plan Summary: Medication management and Plan increase Risperdal to 3 mg at night.  Encourage patient to attend groups and interact with others.  Alethia Berthold, MD 12/09/2021, 5:10 PM

## 2021-12-10 DIAGNOSIS — F29 Unspecified psychosis not due to a substance or known physiological condition: Secondary | ICD-10-CM | POA: Diagnosis not present

## 2021-12-10 NOTE — Plan of Care (Signed)
Patient rated his depression 3/10 and anxiety 0/10. Patient more visible in the milieu,watching TV and outside at the courtyard. Patient denies SI,HI and AVH and no issues verbalized. Patient states that his goal is get back to school to do something in mechanical. Patient does not elaborate any conversation. Appetite and energy level good. Support and encouragement given.

## 2021-12-10 NOTE — Group Note (Signed)
[facility] LCSW Group Therapy Note   Group Date: 12/10/2021 Start Time: 1330 End Time: 1430   Type of Therapy/Topic:  Group Therapy:  Emotion Regulation  Participation Level:  Did Not Attend    Description of Group:    The purpose of this group is to assist patients in learning to regulate negative emotions and experience positive emotions. Patients will be guided to discuss ways in which they have been vulnerable to their negative emotions. These vulnerabilities will be juxtaposed with experiences of positive emotions or situations, and patients challenged to use positive emotions to combat negative ones. Special emphasis will be placed on coping with negative emotions in conflict situations, and patients will process healthy conflict resolution skills.  Therapeutic Goals: Patient will identify two positive emotions or experiences to reflect on in order to balance out negative emotions:  Patient will label two or more emotions that they find the most difficult to experience:  Patient will be able to demonstrate positive conflict resolution skills through discussion or role plays:   Summary of Patient Progress: X   Therapeutic Modalities:   Cognitive Behavioral Therapy Feelings Identification Dialectical Behavioral Therapy   Ambriella Kitt R Maddilynn Esperanza, LCSW 

## 2021-12-10 NOTE — Progress Notes (Addendum)
Recreation Therapy Notes     Date: 12/10/2021   Time: 10:30 am    Location: Craft room     Behavioral response: Appropriate   Intervention Topic: Stress Management   Discussion/Intervention:  Group content on today was focused on stress. The group defined stress and way to cope with stress. Participants expressed how they know when they are stresses out. Individuals described the different ways they have to cope with stress. The group stated reasons why it is important to cope with stress. Patient explained what good stress is and some examples. The group participated in the intervention "Stress Management". Individuals were able to brainstorm new ways to manage their stress.  Clinical Observations/Feedback: Patient came to group and was focused on what peers and staff had to say about stress management. Individual was social with peers and staff while participating in the intervention.   Rubbie Goostree LRT/CTRS               Cobey Raineri 12/10/2021 11:23 AM

## 2021-12-10 NOTE — BH IP Treatment Plan (Signed)
Interdisciplinary Treatment and Diagnostic Plan Update  12/10/2021 Time of Session: 8:30AM Logan Reyes MRN: [id number]  Principal Diagnosis: Psychosis [facility])  Secondary Diagnoses: Principal Problem:   Psychosis (HCC) Active Problems:   Mood disorder (HCC)   Current Medications:  Current Facility-Administered Medications  Medication Dose Route Frequency Provider Last Rate Last Admin   acetaminophen (TYLENOL) tablet 650 mg  650 mg Oral Q6H PRN Gillermo Murdoch, NP   650 mg at 12/05/21 0623   alum & mag hydroxide-simeth (MAALOX/MYLANTA) 200-200-20 MG/5ML suspension 30 mL  30 mL Oral Q4H PRN Gillermo Murdoch, NP       benztropine (COGENTIN) tablet 0.5 mg  0.5 mg Oral QHS Clapacs, John T, MD   0.5 mg at 12/09/21 2118   hydrOXYzine (ATARAX) tablet 100 mg  100 mg Oral QHS Gillermo Murdoch, NP   100 mg at 12/09/21 2118   magnesium hydroxide (MILK OF MAGNESIA) suspension 30 mL  30 mL Oral Daily PRN Gillermo Murdoch, NP       risperiDONE (RISPERDAL M-TABS) disintegrating tablet 3 mg  3 mg Oral QHS Clapacs, John T, MD   3 mg at 12/09/21 2118   PTA Medications: Medications Prior to Admission  Medication Sig Dispense Refill Last Dose   [EXPIRED] cephALEXin (KEFLEX) 500 MG capsule Take 2 capsules (1,000 mg total) by mouth 2 (two) times daily for 7 days. 28 capsule 0    hydrOXYzine (VISTARIL) 100 MG capsule Take 1 capsule (100 mg total) by mouth at bedtime. 30 capsule 0     Patient Stressors: Marital or family conflict   Medication change or noncompliance    Patient Strengths: Motivation for treatment/growth  Supportive family/friends   Treatment Modalities: Medication Management, Group therapy, Case management,  1 to 1 session with clinician, Psychoeducation, Recreational therapy.   Physician Treatment Plan for Primary Diagnosis: Psychosis (HCC) Long Term Goal(s): Improvement in symptoms so as ready for discharge   Short Term Goals: Ability to maintain clinical  measurements within normal limits will improve Compliance with prescribed medications will improve Ability to verbalize feelings will improve Ability to disclose and discuss suicidal ideas Ability to demonstrate self-control will improve  Medication Management: Evaluate patient's response, side effects, and tolerance of medication regimen.  Therapeutic Interventions: 1 to 1 sessions, Unit Group sessions and Medication administration.  Evaluation of Outcomes: Progressing  Physician Treatment Plan for Secondary Diagnosis: Principal Problem:   Psychosis (HCC) Active Problems:   Mood disorder (HCC)  Long Term Goal(s): Improvement in symptoms so as ready for discharge   Short Term Goals: Ability to maintain clinical measurements within normal limits will improve Compliance with prescribed medications will improve Ability to verbalize feelings will improve Ability to disclose and discuss suicidal ideas Ability to demonstrate self-control will improve     Medication Management: Evaluate patient's response, side effects, and tolerance of medication regimen.  Therapeutic Interventions: 1 to 1 sessions, Unit Group sessions and Medication administration.  Evaluation of Outcomes: Progressing   RN Treatment Plan for Primary Diagnosis: Psychosis (HCC) Long Term Goal(s): Knowledge of disease and therapeutic regimen to maintain health will improve  Short Term Goals: Ability to remain free from injury will improve, Ability to verbalize frustration and anger appropriately will improve, Ability to demonstrate self-control, Ability to participate in decision making will improve, Ability to verbalize feelings will improve, Ability to disclose and discuss suicidal ideas, Ability to identify and develop effective coping behaviors will improve, and Compliance with prescribed medications will improve  Medication Management: RN will administer medications as ordered  by provider, will assess and evaluate  patient's response and provide education to patient for prescribed medication. RN will report any adverse and/or side effects to prescribing provider.  Therapeutic Interventions: 1 on 1 counseling sessions, Psychoeducation, Medication administration, Evaluate responses to treatment, Monitor vital signs and CBGs as ordered, Perform/monitor CIWA, COWS, AIMS and Fall Risk screenings as ordered, Perform wound care treatments as ordered.  Evaluation of Outcomes: Progressing   LCSW Treatment Plan for Primary Diagnosis: Psychosis (HCC) Long Term Goal(s): Safe transition to appropriate next level of care at discharge, Engage patient in therapeutic group addressing interpersonal concerns.  Short Term Goals: Engage patient in aftercare planning with referrals and resources, Increase social support, Increase ability to appropriately verbalize feelings, Increase emotional regulation, Facilitate acceptance of mental health diagnosis and concerns, Facilitate patient progression through stages of change regarding substance use diagnoses and concerns, Identify triggers associated with mental health/substance abuse issues, and Increase skills for wellness and recovery  Therapeutic Interventions: Assess for all discharge needs, 1 to 1 time with Social worker, Explore available resources and support systems, Assess for adequacy in community support network, Educate family and significant other(s) on suicide prevention, Complete Psychosocial Assessment, Interpersonal group therapy.  Evaluation of Outcomes: Progressing   Progress in Treatment: Attending groups: No. Participating in groups: No. Taking medication as prescribed: Yes. Toleration medication: Yes. Family/Significant other contact made: Yes, individual(s) contacted:  mother, [name]. Patient understands diagnosis: Yes. Discussing patient identified problems/goals with staff: Yes. Medical problems stabilized or resolved: Yes. Denies  suicidal/homicidal ideation: Yes. Issues/concerns per patient self-inventory: No. Other: none.  New problem(s) identified: No, Describe:  none identified. 12/10/21 Update: No changes at this time.   New Short Term/Long Term Goal(s): detox, elimination of symptoms of psychosis, medication management for mood stabilization; elimination of SI thoughts; development of comprehensive mental wellness/sobriety plan. 12/10/21 Update: No changes at this time.   Patient Goals:  "Read. I'd like to just read." 12/10/21 Update: No changes at this time.   Discharge Plan or Barriers: CSW will assist pt with development of an appropriate aftercare/discharge plan. 12/10/21 Update: No changes at this time.   Reason for Continuation of Hospitalization: Medication stabilization Other; describe psychosis   Estimated Length of Stay: 1-7 days 12/10/21 Update: No changes at this time.  Last 3 [place] Suicide Severity Risk Score: Flowsheet Row Admission (Current) from 12/04/2021 in [facility] INPATIENT BEHAVIORAL MEDICINE ED from 12/03/2021 in [facility] EMERGENCY DEPART[facility] ED from 12/02/2021 in [facility] EMERGENCY DEPARTMENT  C-SSRS RISK CATEGORY No Risk No Risk No Risk       Last PHQ 2/9 Scores:     No data to display          Scribe for Treatment Team: Glenis Smoker, LCSW 12/10/2021 10:16 AM

## 2021-12-10 NOTE — Progress Notes (Signed)
[facility] MD Progress Note  12/10/2021 2:46 PM Logan Reyes  MRN:  [id number] Subjective: Follow-up for this 21 year old man with recent onset psychosis.  Patient does not have any specific complaints.  Continues to stay pretty isolated to his room little activity but taking care of his hygiene adequately.  Denies having any hallucinations.  Denies any suicidal thoughts not aggressive or violent.  Able to answer questions lucidly but with little detail. Principal Problem: Psychosis (HCC) Diagnosis: Principal Problem:   Psychosis (HCC) Active Problems:   Mood disorder (HCC)  Total Time spent with patient: 30 minutes  Past Psychiatric History: No past psychiatric history identified  Past Medical History: History reviewed. No pertinent past medical history. History reviewed. No pertinent surgical history. Family History: History reviewed. No pertinent family history. Family Psychiatric  History: None reported Social History:  Social History   Substance and Sexual Activity  Alcohol Use Never     Social History   Substance and Sexual Activity  Drug Use Yes   Types: Marijuana    Social History   Socioeconomic History   Marital status: Single    Spouse name: Not on file   Number of children: Not on file   Years of education: Not on file   Highest education level: Not on file  Occupational History   Not on file  Tobacco Use   Smoking status: Never   Smokeless tobacco: Never  Vaping Use   Vaping Use: Never used  Substance and Sexual Activity   Alcohol use: Never   Drug use: Yes    Types: Marijuana   Sexual activity: Not Currently  Other Topics Concern   Not on file  Social History Narrative   Not on file   Social Determinants of Health   Financial Resource Strain: Not on file  Food Insecurity: No Food Insecurity (12/04/2021)   Hunger Vital Sign    Worried About Running Out of Food in the Last Year: Never true    Ran Out of Food in the Last Year: Never true   Transportation Needs: No Transportation Needs (12/04/2021)   PRAPARE - Administrator, Civil Service (Medical): No    Lack of Transportation (Non-Medical): No  Physical Activity: Not on file  Stress: Not on file  Social Connections: Not on file   Additional Social History:                         Sleep: Fair  Appetite:  Fair  Current Medications: Current Facility-Administered Medications  Medication Dose Route Frequency Provider Last Rate Last Admin   acetaminophen (TYLENOL) tablet 650 mg  650 mg Oral Q6H PRN Gillermo Murdoch, NP   650 mg at 12/05/21 0623   alum & mag hydroxide-simeth (MAALOX/MYLANTA) 200-200-20 MG/5ML suspension 30 mL  30 mL Oral Q4H PRN Gillermo Murdoch, NP       benztropine (COGENTIN) tablet 0.5 mg  0.5 mg Oral QHS Tiegan Jambor T, MD   0.5 mg at 12/09/21 2118   hydrOXYzine (ATARAX) tablet 100 mg  100 mg Oral QHS Gillermo Murdoch, NP   100 mg at 12/09/21 2118   magnesium hydroxide (MILK OF MAGNESIA) suspension 30 mL  30 mL Oral Daily PRN Gillermo Murdoch, NP       risperiDONE (RISPERDAL M-TABS) disintegrating tablet 3 mg  3 mg Oral QHS Sameul Tagle, Jackquline Denmark, MD   3 mg at 12/09/21 2118    Lab Results: No results found for this or any previous  visit (from the past 48 hour(s)).  Blood Alcohol level:  Lab Results  Component Value Date   ETH <10 12/03/2021    Metabolic Disorder Labs: Lab Results  Component Value Date   HGBA1C 5.0 12/05/2021   MPG 96.8 12/05/2021   No results found for: "PROLACTIN" Lab Results  Component Value Date   CHOL 120 12/05/2021   TRIG 47 12/05/2021   HDL 43 12/05/2021   CHOLHDL 2.8 12/05/2021   VLDL 9 12/05/2021   LDLCALC 68 12/05/2021    Physical Findings: AIMS: Facial and Oral Movements Muscles of Facial Expression: None, normal Lips and Perioral Area: None, normal Jaw: None, normal Tongue: None, normal,Extremity Movements Upper (arms, wrists, hands, fingers): None, normal Lower (legs,  knees, ankles, toes): None, normal, Trunk Movements Neck, shoulders, hips: None, normal, Overall Severity Severity of abnormal movements (highest score from questions above): None, normal Incapacitation due to abnormal movements: None, normal Patient's awareness of abnormal movements (rate only patient's report): No Awareness, Dental Status Current problems with teeth and/or dentures?: No Does patient usually wear dentures?: No  CIWA:    COWS:     Musculoskeletal: Strength & Muscle Tone: within normal limits Gait & Station: normal Patient leans: N/A  Psychiatric Specialty Exam:  Presentation  General Appearance: Appropriate for Environment; Casual; Neat  Eye Contact:Fair  Speech:Slow  Speech Volume:Decreased  Handedness:Right   Mood and Affect  Mood:Anxious  Affect:Blunt   Thought Process  Thought Processes:Linear  Descriptions of Associations:Circumstantial  Orientation:Full (Time, Place and Person)  Thought Content:Logical  History of Schizophrenia/Schizoaffective disorder:No data recorded Duration of Psychotic Symptoms:No data recorded Hallucinations:No data recorded Ideas of Reference:Paranoia  Suicidal Thoughts:No data recorded Homicidal Thoughts:No data recorded  Sensorium  Memory:Immediate Fair; Immediate Good  Judgment:Fair  Insight:Fair   Executive Functions  Concentration:Fair  Attention Span:Fair  Recall:Fair  Fund of Knowledge:Fair  Language:Fair   Psychomotor Activity  Psychomotor Activity:No data recorded  Assets  Assets:Communication Skills; Desire for Improvement; Financial Resources/Insurance; Housing; Social Support   Sleep  Sleep:No data recorded   Physical Exam: Physical Exam Vitals and nursing note reviewed.  Constitutional:      Appearance: Normal appearance.  HENT:     Head: Normocephalic and atraumatic.     Mouth/Throat:     Pharynx: Oropharynx is clear.  Eyes:     Pupils: Pupils are equal, round, and  reactive to light.  Cardiovascular:     Rate and Rhythm: Normal rate and regular rhythm.  Pulmonary:     Effort: Pulmonary effort is normal.     Breath sounds: Normal breath sounds.  Abdominal:     General: Abdomen is flat.     Palpations: Abdomen is soft.  Musculoskeletal:        General: Normal range of motion.  Skin:    General: Skin is warm and dry.  Neurological:     General: No focal deficit present.     Mental Status: He is alert. Mental status is at baseline.  Psychiatric:        Attention and Perception: He is inattentive.        Mood and Affect: Mood normal. Affect is blunt.        Speech: Speech is delayed.        Behavior: Behavior is slowed.        Thought Content: Thought content normal.   Review of Systems  Constitutional: Negative.   HENT: Negative.    Eyes: Negative.   Respiratory: Negative.    Cardiovascular: Negative.  Gastrointestinal: Negative.   Musculoskeletal: Negative.   Skin: Negative.   Neurological: Negative.   Psychiatric/Behavioral: Negative.     Blood pressure 116/84, pulse (!) 112, temperature 97.8 F (36.6 C), temperature source Oral, resp. rate 20, height 5\' 11"  (1.803 m), weight 74.8 kg, SpO2 100 %. Body mass index is 23 kg/m.   Treatment Plan Summary: Medication management and Plan patient had recent onset psychosis is now rather passive.  Neither asking for discharge nor interacting much on the unit.  Taking medication well.  I am going to contact his family today I think we may be getting close to the best baseline we will get for a first hospitalization and start thinking of discharge by the end of the week  , MD 12/10/2021, 2:46 PM

## 2021-12-10 NOTE — [facility] Group Notes (Signed)
[facility] Group Notes:  (Nursing/MHT/Case Management/Adjunct)  Date:  12/10/2021  Time:  9:09 PM  Type of Therapy:   Wrap up  Participation Level:  Active  Participation Quality:  Appropriate  Affect:  Appropriate  Cognitive:  Alert and looking off sometime in space  Insight:  Good  Engagement in Group:  Engaged and said his goal is to go to college  Modes of Intervention:  Support  Summary of Progress/Problems:  Mayra Neer 12/10/2021, 9:09 PM

## 2021-12-10 NOTE — Plan of Care (Signed)
Pt denies experiencing SI/HI/AVH and pain at this time. Pt interacts minimally with staff and others on the unit. Pt reports visit went well with mother. Pt is complaint with medication administration per MD orders. Pt given education, support and encouragement. Pt being monitored q15 minutes for safety per unit protocol, remains safe on the unit. Plan of care ongoing.   Pt did not sleep an entire 8 hours. Pt slept approximately 6 hours.   Problem: Activity: Goal: Risk for activity intolerance will decrease Outcome: Progressing   Problem: Coping: Goal: Level of anxiety will decrease Outcome: Progressing

## 2021-12-11 DIAGNOSIS — F29 Unspecified psychosis not due to a substance or known physiological condition: Secondary | ICD-10-CM | POA: Diagnosis not present

## 2021-12-11 MED ORDER — RISPERIDONE 3 MG PO TBDP: 3.0000 mg | ORAL_TABLET | Freq: Every day | ORAL | 1 refills | Status: AC

## 2021-12-11 MED ORDER — HYDROXYZINE HCL 50 MG PO TABS: 100.0000 mg | ORAL_TABLET | Freq: Every day | ORAL | 1 refills | Status: AC

## 2021-12-11 MED ORDER — BENZTROPINE MESYLATE 0.5 MG PO TABS: 0.5000 mg | ORAL_TABLET | Freq: Every day | ORAL | 1 refills | Status: AC

## 2021-12-11 MED ORDER — BENZTROPINE MESYLATE 0.5 MG PO TABS: 0.5000 mg | ORAL_TABLET | Freq: Every day | ORAL | 1 refills | Status: DC

## 2021-12-11 NOTE — Progress Notes (Signed)
Recreation Therapy Notes  INPATIENT RECREATION TR PLAN  Patient Details Name: Logan Reyes MRN: [id number] DOB: 2000-10-28 Today's Date: 12/11/2021  Rec Therapy Plan Is patient appropriate for Therapeutic Recreation?: Yes Treatment times per week: at least 3 Estimated Length of Stay: 5-7 days TR Treatment/Interventions: Group participation (Comment)  Discharge Criteria Pt will be discharged from therapy if:: Discharged Treatment plan/goals/alternatives discussed and agreed upon by:: Patient/family  Discharge Summary Short term goals set: Patient will engage in groups without prompting or encouragement from LRT x3 group sessions within 5 recreation therapy group sessions Short term goals met: Adequate for discharge Progress toward goals comments: Groups attended Which groups?: Stress management, Leisure education Reason goals not met: N/A Therapeutic equipment acquired: N/A Reason patient discharged from therapy: Discharge from hospital Pt/family agrees with progress & goals achieved: Yes Date patient discharged from therapy: 12/11/21   Chaselynn Kepple 12/11/2021, 1:21 PM

## 2021-12-11 NOTE — Progress Notes (Signed)
Recreation Therapy Notes Date: 12/11/2021   Time: 10:10 am    Location: Court yard    [employer] response: Appropriate   Intervention Topic: Leisure    Discussion/Intervention:  Group content today was focused on leisure. The group defined what leisure is and some positive leisure activities they participate in. Individuals identified the difference between good and bad leisure. Participants expressed how they feel after participating in the leisure of their choice. The group discussed how they go about picking a leisure activity and if others are involved in their leisure activities. The patient stated how many leisure activities they too choose from and reasons why it is important to have leisure time. Individuals participated in the intervention "Leisure Jeopardy" where they had a chance to identify new leisure activities as well as benefits of leisure. Clinical Observations/Feedback: Patient came to group and was able to explore and participate in many leisure activities. Participant was able to identify leisure activities they enjoy outside of the hospital. Individual was social with peers and staff while participating in the intervention.   Yamira Papa LRT/CTRS             Marelin Tat 12/11/2021 12:45 PM

## 2021-12-11 NOTE — Progress Notes (Signed)
  [facility] Adult Case Management Discharge Plan :  Will you be returning to the same living situation after discharge:  No. At discharge, do you have transportation home?: Yes,  pt arranged transportation through his support system.  Do you have the ability to pay for your medications: Yes,  [employer].  Release of information consent forms completed and in the chart;  Patient's signature needed at discharge.  Patient to Follow up at:  Follow-up Information     [facility] Follow up.   Why: They have walk-in hours on Mondays, Wednesdays, and Fridays from 8am-4pm. Clients are seen on a first come, first served basis and it is recommended that you arrive by 7:30am in order to be seen. Thanks! Contact information: [address] [place] [address] [phone]                 Next level of care provider has access to [facility] Link:no  Safety Planning and Suicide Prevention discussed: Yes,  SPE completed with mother, Jaree Trinka.      Has patient been referred to the Quitline?: N/A patient is not a smoker  Patient has been referred for addiction treatment: N/A  Glenis Smoker, LCSW 12/11/2021, 1:26 PM

## 2021-12-11 NOTE — [facility] Suicide Risk Assessment (Signed)
[facility] INPATIENT:  Family/Significant Other Suicide Prevention Education  Suicide Prevention Education:  Education Completed; Tandaline Calzadilla/mother [phone]), has been identified by the patient as the family member/significant other with whom the patient will be residing, and identified as the person(s) who will aid the patient in the event of a mental health crisis (suicidal ideations/suicide attempt).  With written consent from the patient, the family member/significant other has been provided the following suicide prevention education, prior to the and/or following the discharge of the patient.  The suicide prevention education provided includes the following: Suicide risk factors Suicide prevention and interventions National Suicide Hotline telephone number [facility] assessment telephone number [facility] Emergency Assistance 911 [facility] and/or Residential Mobile Crisis Unit telephone number  Request made of family/significant other to: Remove weapons (e.g., guns, rifles, knives), all items previously/currently identified as safety concern.   Remove drugs/medications (over-the-counter, prescriptions, illicit drugs), all items previously/currently identified as a safety concern.  The family member/significant other verbalizes understanding of the suicide prevention education information provided.  The family member/significant other agrees to remove the items of safety concern listed above.  Glenis Smoker 12/11/2021, 1:29 PM

## 2021-12-11 NOTE — Discharge Summary (Signed)
Physician Discharge Summary Note  Patient:  Logan Reyes is an 21 y.o., male MRN:  [id number] DOB:  Dec 18, 2000 Patient phone:  [phone] (home)  Patient address:   [address] [facility] [address],  Total Time spent with patient: 30 minutes  Date of Admission:  12/04/2021 Date of Discharge: 12/11/2021  Reason for Admission: Patient was admitted after an episode of agitated bizarre behavior appearing to be caused by psychotic symptoms  Principal Problem: Psychosis [facility]) Discharge Diagnoses: Principal Problem:   Psychosis (HCC) Active Problems:   Mood disorder (HCC)   Past Psychiatric History: No past history of treatment identified  Past Medical History: History reviewed. No pertinent past medical history. History reviewed. No pertinent surgical history. Family History: History reviewed. No pertinent family history. Family Psychiatric  History: See previous Social History:  Social History   Substance and Sexual Activity  Alcohol Use Never     Social History   Substance and Sexual Activity  Drug Use Yes   Types: Marijuana    Social History   Socioeconomic History   Marital status: Single    Spouse name: Not on file   Number of children: Not on file   Years of education: Not on file   Highest education level: Not on file  Occupational History   Not on file  Tobacco Use   Smoking status: Never   Smokeless tobacco: Never  Vaping Use   Vaping Use: Never used  Substance and Sexual Activity   Alcohol use: Never   Drug use: Yes    Types: Marijuana   Sexual activity: Not Currently  Other Topics Concern   Not on file  Social History Narrative   Not on file   Social Determinants of Health   Financial Resource Strain: Not on file  Food Insecurity: No Food Insecurity (12/04/2021)   Hunger Vital Sign    Worried About Running Out of Food in the Last Year: Never true    Ran Out of Food in the Last Year: Never true  Transportation Needs: No Transportation Needs  (12/04/2021)   PRAPARE - Administrator, Civil Service (Medical): No    Lack of Transportation (Non-Medical): No  Physical Activity: Not on file  Stress: Not on file  Social Connections: Not on file    Hospital Course: Admitted to psychiatric unit.  15-minute checks continued.  Patient appeared to still be experiencing psychotic symptoms and the history of events leading to hospitalization were most consistent with early onset psychosis.  I recommended starting low-dose of Risperdal for psychosis.  Patient agreed to treatment plan.  Dose has been titrated up to 3 mg at night accompanied by small dose of Cogentin which he has tolerated well.  Patient mostly stays to himself but does go out for some recreation activities and has been eating fine.  On individual interview he seems to have calmed down quite a bit and no longer endorses any hallucinations or suicidal thoughts.  He does however still seem to be blunted and flat with some thought blocking and slowing.  At this point I think he no longer requires acute inpatient treatment as he has a safe place to stay.  He has been educated about the illness and strongly encouraged to stay on medication and follow up with outpatient treatment and avoid all substance abuse.  Physical Findings: AIMS: Facial and Oral Movements Muscles of Facial Expression: None, normal Lips and Perioral Area: None, normal Jaw: None, normal Tongue: None, normal,Extremity Movements Upper (arms,  wrists, hands, fingers): None, normal Lower (legs, knees, ankles, toes): None, normal, Trunk Movements Neck, shoulders, hips: None, normal, Overall Severity Severity of abnormal movements (highest score from questions above): None, normal Incapacitation due to abnormal movements: None, normal Patient's awareness of abnormal movements (rate only patient's report): No Awareness, Dental Status Current problems with teeth and/or dentures?: No Does patient usually wear  dentures?: No  CIWA:    COWS:     Musculoskeletal: Strength & Muscle Tone: within normal limits Gait & Station: normal Patient leans: N/A   Psychiatric Specialty Exam:  Presentation  General Appearance: Appropriate for Environment; Casual; Neat  Eye Contact:Fair  Speech:Slow  Speech Volume:Decreased  Handedness:Right   Mood and Affect  Mood:Anxious  Affect:Blunt   Thought Process  Thought Processes:Linear  Descriptions of Associations:Circumstantial  Orientation:Full (Time, Place and Person)  Thought Content:Logical  History of Schizophrenia/Schizoaffective disorder:No data recorded Duration of Psychotic Symptoms:No data recorded Hallucinations:No data recorded Ideas of Reference:Paranoia  Suicidal Thoughts:No data recorded Homicidal Thoughts:No data recorded  Sensorium  Memory:Immediate Fair; Immediate Good  Judgment:Fair  Insight:Fair   Executive Functions  Concentration:Fair  Attention Span:Fair  Recall:Fair  Fund of Knowledge:Fair  Language:Fair   Psychomotor Activity  Psychomotor Activity:No data recorded  Assets  Assets:Communication Skills; Desire for Improvement; Financial Resources/Insurance; Housing; Social Support   Sleep  Sleep:No data recorded   Physical Exam: Physical Exam Vitals and nursing note reviewed.  Constitutional:      Appearance: Normal appearance.  HENT:     Head: Normocephalic and atraumatic.     Mouth/Throat:     Pharynx: Oropharynx is clear.  Eyes:     Pupils: Pupils are equal, round, and reactive to light.  Cardiovascular:     Rate and Rhythm: Normal rate and regular rhythm.  Pulmonary:     Effort: Pulmonary effort is normal.     Breath sounds: Normal breath sounds.  Abdominal:     General: Abdomen is flat.     Palpations: Abdomen is soft.  Musculoskeletal:        General: Normal range of motion.  Skin:    General: Skin is warm and dry.  Neurological:     General: No focal deficit  present.     Mental Status: He is alert. Mental status is at baseline.  Psychiatric:        Mood and Affect: Mood normal.        Thought Content: Thought content normal.    Review of Systems  Constitutional: Negative.   HENT: Negative.    Eyes: Negative.   Respiratory: Negative.    Cardiovascular: Negative.   Gastrointestinal: Negative.   Musculoskeletal: Negative.   Skin: Negative.   Neurological: Negative.   Psychiatric/Behavioral: Negative.     Blood pressure (!) 146/110, pulse (!) 104, temperature 98.6 F (37 C), temperature source Oral, resp. rate 18, height 5\' 11"  (1.803 m), weight 74.8 kg, SpO2 100 %. Body mass index is 23 kg/m.   Social History   Tobacco Use  Smoking Status Never  Smokeless Tobacco Never   Tobacco Cessation:  N/A, patient does not currently use tobacco products   Blood Alcohol level:  Lab Results  Component Value Date   ETH <10 12/03/2021    Metabolic Disorder Labs:  Lab Results  Component Value Date   HGBA1C 5.0 12/05/2021   MPG 96.8 12/05/2021   No results found for: "PROLACTIN" Lab Results  Component Value Date   CHOL 120 12/05/2021   TRIG 47 12/05/2021   HDL 43  12/05/2021   CHOLHDL 2.8 12/05/2021   VLDL 9 12/05/2021   LDLCALC 68 12/05/2021    See Psychiatric Specialty Exam and Suicide Risk Assessment completed by Attending Physician prior to discharge.  Discharge destination:  Home  Is patient on multiple antipsychotic therapies at discharge:  No   Has Patient had three or more failed trials of antipsychotic monotherapy by history:  No  Recommended Plan for Multiple Antipsychotic Therapies: NA  Discharge Instructions     Diet - low sodium heart healthy   Complete by: As directed    Increase activity slowly   Complete by: As directed       Allergies as of 12/11/2021   No Known Allergies      Medication List     STOP taking these medications    cephALEXin 500 MG capsule Commonly known as: KEFLEX    hydrOXYzine 100 MG capsule Commonly known as: VISTARIL Replaced by: hydrOXYzine 50 MG tablet       TAKE these medications      Indication  benztropine 0.5 MG tablet Commonly known as: COGENTIN Take 1 tablet (0.5 mg total) by mouth at bedtime.  Indication: Extrapyramidal Reaction caused by Medications   hydrOXYzine 50 MG tablet Commonly known as: ATARAX Take 2 tablets (100 mg total) by mouth at bedtime. Replaces: hydrOXYzine 100 MG capsule  Indication: insomnia   risperiDONE 3 MG disintegrating tablet Commonly known as: RISPERDAL M-TABS Take 1 tablet (3 mg total) by mouth at bedtime.  Indication: Schizophrenia        Follow-up Information     [facility] Follow up.   Why: They have walk-in hours on Mondays, Wednesdays, and Fridays from 8am-4pm. Clients are seen on a first come, first served basis and it is recommended that you arrive by 7:30am in order to be seen. Thanks! Contact information: [address] [place] [address] [phone]                 Follow-up recommendations:  Other:  Recommend follow-up with [facility]  Comments: Prescriptions provided  Signed: Mordecai Rasmussen, MD 12/11/2021, 11:40 AM

## 2021-12-11 NOTE — Progress Notes (Signed)
Patient denies SI, HI, and AVH. He denies pain and other physical problems. Patient is guarded and interacts minimally with others. However, he is observed to be out in the milieu throughout the morning. Patient remains safe on the unit at this time.

## 2021-12-11 NOTE — Plan of Care (Signed)
  Problem: Group Participation Goal: STG - Patient will engage in groups without prompting or encouragement from LRT x3 group sessions within 5 recreation therapy group sessions Description: STG - Patient will engage in groups without prompting or encouragement from LRT x3 group sessions within 5 recreation therapy group sessions 12/11/2021 1319 by Alveria Apley, LRT Outcome: Adequate for Discharge 12/11/2021 1319 by Alveria Apley, LRT Outcome: Adequate for Discharge

## 2021-12-11 NOTE — [facility] Suicide Risk Assessment (Signed)
[facility] Discharge Suicide Risk Assessment   Principal Problem: Psychosis [facility]) Discharge Diagnoses: Principal Problem:   Psychosis (HCC) Active Problems:   Mood disorder (HCC)   Total Time spent with patient: 30 minutes  Musculoskeletal: Strength & Muscle Tone: within normal limits Gait & Station: normal Patient leans: N/A  Psychiatric Specialty Exam  Presentation  General Appearance: Appropriate for Environment; Casual; Neat  Eye Contact:Fair  Speech:Slow  Speech Volume:Decreased  Handedness:Right   Mood and Affect  Mood:Anxious  Duration of Depression Symptoms: No data recorded Affect:Blunt   Thought Process  Thought Processes:Linear  Descriptions of Associations:Circumstantial  Orientation:Full (Time, Place and Person)  Thought Content:Logical  History of Schizophrenia/Schizoaffective disorder:No data recorded Duration of Psychotic Symptoms:No data recorded Hallucinations:No data recorded Ideas of Reference:Paranoia  Suicidal Thoughts:No data recorded Homicidal Thoughts:No data recorded  Sensorium  Memory:Immediate Fair; Immediate Good  Judgment:Fair  Insight:Fair   Executive Functions  Concentration:Fair  Attention Span:Fair  Recall:Fair  Fund of Knowledge:Fair  Language:Fair   Psychomotor Activity  Psychomotor Activity:No data recorded  Assets  Assets:Communication Skills; Desire for Improvement; Financial Resources/Insurance; Housing; Social Support   Sleep  Sleep:No data recorded  Physical Exam: Physical Exam Vitals and nursing note reviewed.  Constitutional:      Appearance: Normal appearance.  HENT:     Head: Normocephalic and atraumatic.     Mouth/Throat:     Pharynx: Oropharynx is clear.  Eyes:     Pupils: Pupils are equal, round, and reactive to light.  Cardiovascular:     Rate and Rhythm: Normal rate and regular rhythm.  Pulmonary:     Effort: Pulmonary effort is normal.     Breath sounds: Normal breath  sounds.  Abdominal:     General: Abdomen is flat.     Palpations: Abdomen is soft.  Musculoskeletal:        General: Normal range of motion.  Skin:    General: Skin is warm and dry.  Neurological:     General: No focal deficit present.     Mental Status: He is alert. Mental status is at baseline.  Psychiatric:        Mood and Affect: Mood normal.        Thought Content: Thought content normal.    Review of Systems  Constitutional: Negative.   HENT: Negative.    Eyes: Negative.   Respiratory: Negative.    Cardiovascular: Negative.   Gastrointestinal: Negative.   Musculoskeletal: Negative.   Skin: Negative.   Neurological: Negative.    Blood pressure (!) 146/110, pulse (!) 104, temperature 98.6 F (37 C), temperature source Oral, resp. rate 18, height 5\' 11"  (1.803 m), weight 74.8 kg, SpO2 100 %. Body mass index is 23 kg/m.  Mental Status Per Nursing Assessment::   On Admission:  NA  Demographic Factors:  Male and Unemployed  Loss Factors: NA  Historical Factors: Impulsivity  Risk Reduction Factors:   Sense of responsibility to family and Living with another person, especially a relative  Continued Clinical Symptoms:  Schizophrenia:   Less than 71 years old  Cognitive Features That Contribute To Risk:  None    Suicide Risk:  Minimal: No identifiable suicidal ideation.  Patients presenting with no risk factors but with morbid ruminations; may be classified as minimal risk based on the severity of the depressive symptoms   Follow-up Information     [facility] Follow up.   Why: They have walk-in hours on Mondays, Wednesdays, and Fridays from 8am-4pm. Clients are seen on  a first come, first served basis and it is recommended that you arrive by 7:30am in order to be seen. Thanks! Contact information: [address] [place] [address] [phone]                 Plan Of Care/Follow-up recommendations:  Patient has shown no  behavior problems no sign of acute dangerousness.  He has been compliant with treatment.  He denies any suicidal thoughts.  Patient is agreeable to recommendations for outpatient treatment.  Does not appear to be acutely dangerous or to require inpatient treatment at this point  Mordecai Rasmussen, MD 12/11/2021, 11:36 AM

## 2021-12-11 NOTE — Progress Notes (Signed)
Pt was educated on prescriptions and follow up care. Pt questions were answered and pt verbalized understanding and did not voice any concerns. Pt's belongings were returned. Pt was safely discharged to the [facility]. Patient was not observed to be in any distress at time of discharge and denied SI, HI, and AVH.
